# Patient Record
Sex: Female | Born: 1950 | Race: White | Hispanic: No | Marital: Married | Smoking: Never smoker
Health system: Southern US, Community
[De-identification: ages and names within clinical notes are randomized; demographics above are authoritative.]

## PROBLEM LIST (undated history)

## (undated) DIAGNOSIS — M8580 Other specified disorders of bone density and structure, unspecified site: Secondary | ICD-10-CM

## (undated) DIAGNOSIS — H3530 Unspecified macular degeneration: Secondary | ICD-10-CM

## (undated) DIAGNOSIS — E785 Hyperlipidemia, unspecified: Secondary | ICD-10-CM

## (undated) HISTORY — DX: Unspecified macular degeneration: H35.30

## (undated) HISTORY — DX: Other specified disorders of bone density and structure, unspecified site: M85.80

## (undated) HISTORY — DX: Hyperlipidemia, unspecified: E78.5

---

## 2007-04-24 ENCOUNTER — Ambulatory Visit: Payer: Self-pay | Admitting: Gastroenterology

## 2012-03-23 ENCOUNTER — Ambulatory Visit: Payer: Self-pay | Admitting: Family Medicine

## 2013-04-02 ENCOUNTER — Ambulatory Visit: Payer: Self-pay | Admitting: Family Medicine

## 2014-04-03 ENCOUNTER — Ambulatory Visit: Payer: Self-pay | Admitting: Family Medicine

## 2014-12-25 ENCOUNTER — Ambulatory Visit (INDEPENDENT_AMBULATORY_CARE_PROVIDER_SITE_OTHER): Payer: BLUE CROSS/BLUE SHIELD

## 2014-12-25 DIAGNOSIS — Z23 Encounter for immunization: Secondary | ICD-10-CM

## 2015-06-24 ENCOUNTER — Encounter: Payer: Self-pay | Admitting: Family Medicine

## 2015-06-24 ENCOUNTER — Other Ambulatory Visit: Payer: Self-pay | Admitting: Family Medicine

## 2015-06-24 ENCOUNTER — Ambulatory Visit (INDEPENDENT_AMBULATORY_CARE_PROVIDER_SITE_OTHER): Payer: Managed Care, Other (non HMO) | Admitting: Family Medicine

## 2015-06-24 VITALS — BP 100/62 | HR 74 | Temp 98.6°F | Resp 16 | Ht 65.0 in | Wt 186.2 lb

## 2015-06-24 DIAGNOSIS — Z0000 Encounter for general adult medical examination without abnormal findings: Secondary | ICD-10-CM | POA: Diagnosis not present

## 2015-06-24 DIAGNOSIS — Z780 Asymptomatic menopausal state: Secondary | ICD-10-CM | POA: Diagnosis not present

## 2015-06-24 DIAGNOSIS — Z1239 Encounter for other screening for malignant neoplasm of breast: Secondary | ICD-10-CM

## 2015-06-24 NOTE — Progress Notes (Signed)
Name: Laura Oneill   MRN: [id number]    DOB: October 17, 1950   Date:06/24/2015       Progress Note  Subjective  Chief Complaint  Chief Complaint  Patient presents with  . Annual Exam    CPE    HPI  Pt. Presents for a Complete Physical Exam. She is doing well. Right leg does bother her occasionally. Pain in her buttocks radiating down to her lower leg.   Past Medical History  Diagnosis Date  . Hyperlipidemia     History reviewed. No pertinent past surgical history.  Family History  Problem Relation Age of Onset  . Hypothyroidism Mother   . COPD Father     Social History   Social History  . Marital Status: Married    Spouse Name: N/A  . Number of Children: N/A  . Years of Education: N/A   Occupational History  . Not on file.   Social History Main Topics  . Smoking status: Never Smoker   . Smokeless tobacco: Never Used  . Alcohol Use: 0.0 oz/week    0 Standard drinks or equivalent per week     Comment: occasional  . Drug Use: No  . Sexual Activity: No   Other Topics Concern  . Not on file   Social History Narrative  . No narrative on file     Current outpatient prescriptions:  Marland Kitchen  Multiple Vitamin (MULTIVITAMIN) tablet, Take 1 tablet by mouth daily., Disp: , Rfl:   No Known Allergies   Review of Systems  Constitutional: Negative for fever, chills, weight loss and malaise/fatigue.  HENT: Negative for sore throat.   Eyes: Negative for blurred vision and double vision.  Respiratory: Negative for cough and shortness of breath.   Cardiovascular: Negative for chest pain and leg swelling.  Gastrointestinal: Negative for heartburn, nausea, vomiting, abdominal pain, diarrhea and blood in stool.  Genitourinary: Negative for dysuria and hematuria.  Musculoskeletal: Positive for myalgias (right leg pain, worse with sitting, travels down from buttocks to right lower leg.). Negative for back pain and joint pain.  Skin: Negative for rash.  Neurological: Negative  for dizziness and headaches.  Psychiatric/Behavioral: Negative for depression. The patient is not nervous/anxious and does not have insomnia.       Objective  Filed Vitals:   06/24/15 1105  BP: 100/62  Pulse: 74  Temp: 98.6 F (37 C)  TempSrc: Oral  Resp: 16  Height: 5\' 5"  (1.651 m)  Weight: 186 lb 3.2 oz (84.46 kg)  SpO2: 98%    Physical Exam  Constitutional: She is oriented to person, place, and time and well-developed, well-nourished, and in no distress. Vital signs are normal.  HENT:  Head: Normocephalic and atraumatic.  Right Ear: Tympanic membrane and ear canal normal.  Left Ear: Tympanic membrane and ear canal normal.  Mouth/Throat: No posterior oropharyngeal erythema.  Cardiovascular: Normal rate, regular rhythm, S1 normal and S2 normal.   Pulmonary/Chest: Breath sounds normal.  Abdominal: Normal appearance and bowel sounds are normal. There is no tenderness.  Abdominal wall swelling around the epigastric region, soft, easily reducible.   Neurological: She is alert and oriented to person, place, and time.  Psychiatric: Mood, memory, affect and judgment normal.  Nursing note and vitals reviewed.      Assessment & Plan  1. Annual physical exam Age appropriate laboratory and screening studies obtained.  - CBC with Differential - Comprehensive Metabolic Panel (CMET) - Lipid Profile - TSH - Vitamin D (25 hydroxy) - Cytology -  PAP  2. Screening for breast cancer  - MM Digital Screening; Future  3. Post-menopausal  - DG Bone Density; Future   Montrail Mehrer Asad A. [facility] 06/24/2015 11:27 AM

## 2015-06-25 ENCOUNTER — Encounter: Payer: BLUE CROSS/BLUE SHIELD | Admitting: Family Medicine

## 2015-06-27 LAB — PAP LB, RFX HPV ASCU: PAP SMEAR COMMENT: 0

## 2015-06-28 LAB — CBC WITH DIFFERENTIAL/PLATELET
BASOS: 0 %
Basophils Absolute: 0 10*3/uL (ref 0.0–0.2)
EOS (ABSOLUTE): 0.1 10*3/uL (ref 0.0–0.4)
EOS: 3 %
HEMATOCRIT: 40.3 % (ref 34.0–46.6)
Hemoglobin: 13.4 g/dL (ref 11.1–15.9)
IMMATURE GRANULOCYTES: 0 %
Immature Grans (Abs): 0 10*3/uL (ref 0.0–0.1)
LYMPHS ABS: 1.2 10*3/uL (ref 0.7–3.1)
Lymphs: 23 %
MCH: 29.4 pg (ref 26.6–33.0)
MCHC: 33.3 g/dL (ref 31.5–35.7)
MCV: 88 fL (ref 79–97)
MONOS ABS: 0.5 10*3/uL (ref 0.1–0.9)
Monocytes: 9 %
Neutrophils Absolute: 3.4 10*3/uL (ref 1.4–7.0)
Neutrophils: 65 %
PLATELETS: 250 10*3/uL (ref 150–379)
RBC: 4.56 x10E6/uL (ref 3.77–5.28)
RDW: 12.6 % (ref 12.3–15.4)
WBC: 5.3 10*3/uL (ref 3.4–10.8)

## 2015-06-28 LAB — LIPID PANEL
CHOL/HDL RATIO: 3.6 ratio (ref 0.0–4.4)
Cholesterol, Total: 216 mg/dL — ABNORMAL HIGH (ref 100–199)
HDL: 60 mg/dL (ref >39)
LDL CALC: 140 mg/dL — AB (ref 0–99)
TRIGLYCERIDES: 80 mg/dL (ref 0–149)
VLDL CHOLESTEROL CAL: 16 mg/dL (ref 5–40)

## 2015-06-28 LAB — COMPREHENSIVE METABOLIC PANEL
A/G RATIO: 2 (ref 1.2–2.2)
ALT: 14 IU/L (ref 0–32)
AST: 17 IU/L (ref 0–40)
Albumin: 4.3 g/dL (ref 3.6–4.8)
Alkaline Phosphatase: 60 IU/L (ref 39–117)
BUN/Creatinine Ratio: 24 (ref 11–26)
BUN: 16 mg/dL (ref 8–27)
Bilirubin Total: 0.4 mg/dL (ref 0.0–1.2)
CALCIUM: 9.2 mg/dL (ref 8.7–10.3)
CO2: 26 mmol/L (ref 18–29)
CREATININE: 0.68 mg/dL (ref 0.57–1.00)
Chloride: 103 mmol/L (ref 96–106)
GFR, EST AFRICAN AMERICAN: 108 mL/min/{1.73_m2} (ref >59)
GFR, EST NON AFRICAN AMERICAN: 93 mL/min/{1.73_m2} (ref >59)
GLUCOSE: 91 mg/dL (ref 65–99)
Globulin, Total: 2.1 g/dL (ref 1.5–4.5)
Potassium: 5.3 mmol/L — ABNORMAL HIGH (ref 3.5–5.2)
Sodium: 142 mmol/L (ref 134–144)
TOTAL PROTEIN: 6.4 g/dL (ref 6.0–8.5)

## 2015-06-28 LAB — VITAMIN D 25 HYDROXY (VIT D DEFICIENCY, FRACTURES): Vit D, 25-Hydroxy: 34.2 ng/mL (ref 30.0–100.0)

## 2015-06-28 LAB — TSH: TSH: 2.26 u[IU]/mL (ref 0.450–4.500)

## 2015-08-20 ENCOUNTER — Telehealth: Payer: Self-pay | Admitting: Family Medicine

## 2015-08-20 NOTE — Telephone Encounter (Signed)
I do not see anything for a bone density in her chart. I will forward to [employer]

## 2015-08-20 NOTE — Telephone Encounter (Signed)
Referral for bone density scan was ordered during patient's annual physical exam in March

## 2015-08-20 NOTE — Telephone Encounter (Signed)
Scheduled for 09/03/15 at 11. Left vm with appointment date and time

## 2015-08-20 NOTE — Telephone Encounter (Signed)
Patient is checking status on referral for her bone density. Was seen in March for her annual physical

## 2015-09-03 ENCOUNTER — Ambulatory Visit: Payer: Self-pay

## 2015-10-20 ENCOUNTER — Ambulatory Visit
Admission: RE | Admit: 2015-10-20 | Discharge: 2015-10-20 | Disposition: A | Discharge status: Home / Self Care | Payer: Managed Care, Other (non HMO) | Source: Ambulatory Visit | Attending: Family Medicine | Admitting: Family Medicine

## 2015-10-20 ENCOUNTER — Telehealth: Payer: Self-pay | Admitting: Emergency Medicine

## 2015-10-20 DIAGNOSIS — Z780 Asymptomatic menopausal state: Secondary | ICD-10-CM

## 2015-10-20 DIAGNOSIS — M85851 Other specified disorders of bone density and structure, right thigh: Secondary | ICD-10-CM | POA: Insufficient documentation

## 2015-10-20 DIAGNOSIS — Z1239 Encounter for other screening for malignant neoplasm of breast: Secondary | ICD-10-CM

## 2015-10-20 DIAGNOSIS — Z1231 Encounter for screening mammogram for malignant neoplasm of breast: Secondary | ICD-10-CM | POA: Diagnosis not present

## 2015-10-20 NOTE — Telephone Encounter (Signed)
Patient notified of mammogram results.

## 2015-10-20 NOTE — Telephone Encounter (Signed)
Patient notified of Bone Density results.  

## 2016-01-22 ENCOUNTER — Ambulatory Visit (INDEPENDENT_AMBULATORY_CARE_PROVIDER_SITE_OTHER): Payer: Managed Care, Other (non HMO)

## 2016-01-22 DIAGNOSIS — Z23 Encounter for immunization: Secondary | ICD-10-CM | POA: Diagnosis not present

## 2016-07-22 ENCOUNTER — Encounter: Payer: Managed Care, Other (non HMO) | Admitting: Family Medicine

## 2016-08-23 ENCOUNTER — Telehealth: Payer: Self-pay | Admitting: Family Medicine

## 2016-08-23 NOTE — Telephone Encounter (Signed)
Pt has upcoming appointment this week for her annual and she would like to come tomorrow if possible to go ahead and do lab work due to fasting. Please return call [phone]

## 2016-08-24 NOTE — Telephone Encounter (Signed)
Labs will be ordered during office visit for CPE on 08/25/16

## 2016-08-25 ENCOUNTER — Ambulatory Visit (INDEPENDENT_AMBULATORY_CARE_PROVIDER_SITE_OTHER): Payer: BLUE CROSS/BLUE SHIELD | Admitting: Family Medicine

## 2016-08-25 ENCOUNTER — Encounter: Payer: Self-pay | Admitting: Family Medicine

## 2016-08-25 VITALS — BP 108/76 | HR 73 | Temp 98.4°F | Resp 16 | Ht 65.0 in | Wt 184.7 lb

## 2016-08-25 DIAGNOSIS — Z1239 Encounter for other screening for malignant neoplasm of breast: Secondary | ICD-10-CM

## 2016-08-25 DIAGNOSIS — Z0000 Encounter for general adult medical examination without abnormal findings: Secondary | ICD-10-CM

## 2016-08-25 DIAGNOSIS — E785 Hyperlipidemia, unspecified: Secondary | ICD-10-CM | POA: Insufficient documentation

## 2016-08-25 DIAGNOSIS — Z1159 Encounter for screening for other viral diseases: Secondary | ICD-10-CM | POA: Diagnosis not present

## 2016-08-25 DIAGNOSIS — Z1231 Encounter for screening mammogram for malignant neoplasm of breast: Secondary | ICD-10-CM | POA: Diagnosis not present

## 2016-08-25 DIAGNOSIS — E7800 Pure hypercholesterolemia, unspecified: Secondary | ICD-10-CM | POA: Diagnosis not present

## 2016-08-25 NOTE — Progress Notes (Signed)
Name: Laura Oneill   MRN: [id number]    DOB: 1951-02-23   Date:08/25/2016       Progress Note  Subjective  Chief Complaint  Chief Complaint  Patient presents with  . Annual Exam    CPE    Pt. Presents for Annual Physical Exam.   Last colonoscopy was reportedly in 2009, had a polyp which was benign. Repeat in 2019. She is due for mammogram in the current year. DEXA scan in 2019. Pap Smear was normal in 2017.  She is due for Hepatitis C screening.  She has received Zostavax in the past.   Past Medical History:  Diagnosis Date  . Hyperlipidemia     History reviewed. No pertinent surgical history.  Family History  Problem Relation Age of Onset  . Hypothyroidism Mother   . COPD Father   . Breast cancer Neg Hx     Social History   Social History  . Marital status: Married    Spouse name: N/A  . Number of children: N/A  . Years of education: N/A   Occupational History  . Not on file.   Social History Main Topics  . Smoking status: Never Smoker  . Smokeless tobacco: Never Used  . Alcohol use 0.0 oz/week     Comment: occasional  . Drug use: No  . Sexual activity: No   Other Topics Concern  . Not on file   Social History Narrative  . No narrative on file     Current Outpatient Prescriptions:  Marland Kitchen  Multiple Vitamin (MULTIVITAMIN) tablet, Take 1 tablet by mouth daily., Disp: , Rfl:  .  Multiple Vitamins-Minerals (PRESERVISION AREDS 2 PO), Take by mouth., Disp: , Rfl:   Allergies  Allergen Reactions  . Latex Rash     Review of Systems  Constitutional: Negative for chills, fever, malaise/fatigue and weight loss.  HENT: Negative for congestion, ear pain, sinus pain and sore throat.   Eyes: Negative for blurred vision and double vision.  Respiratory: Negative for cough and shortness of breath.   Cardiovascular: Negative for chest pain and leg swelling.  Gastrointestinal: Negative for abdominal pain, nausea and vomiting.  Genitourinary: Negative for  dysuria and hematuria.  Musculoskeletal: Positive for joint pain (right arm pain, worse with activity such as with computer our counting bills. ). Negative for back pain and neck pain.  Neurological: Negative for dizziness and headaches.  Psychiatric/Behavioral: Negative for depression. The patient is not nervous/anxious and does not have insomnia.       Objective  Vitals:   08/25/16 1108  BP: 108/76  Pulse: 73  Resp: 16  Temp: 98.4 F (36.9 C)  TempSrc: Oral  SpO2: 96%  Weight: 184 lb 11.2 oz (83.8 kg)  Height: 5\' 5"  (1.651 m)    Physical Exam  Constitutional: She is oriented to person, place, and time and well-developed, well-nourished, and in no distress.  HENT:  Head: Normocephalic and atraumatic.  Right Ear: External ear normal.  Left Ear: External ear normal.  Mouth/Throat: Oropharynx is clear and moist.  Eyes: Conjunctivae are normal. Pupils are equal, round, and reactive to light.  Neck: Normal range of motion. Neck supple. No thyromegaly present.  Cardiovascular: Normal rate, regular rhythm and normal heart sounds.   No murmur heard. Pulmonary/Chest: Effort normal and breath sounds normal. She has no wheezes.  Abdominal: Soft. Bowel sounds are normal. There is no tenderness.    Abdominal wall hernia, easily reducible.  Musculoskeletal: She exhibits no edema.  Right wrist: She exhibits tenderness. She exhibits no swelling, no effusion and no crepitus.       Right forearm: She exhibits tenderness. She exhibits no swelling and no edema.       Arms: Neurological: She is alert and oriented to person, place, and time.  Skin: Skin is warm and dry.  Psychiatric: Mood, memory, affect and judgment normal.  Nursing note and vitals reviewed.    Assessment & Plan  1. Well woman exam without gynecological exam Obtain age-appropriate laboratory screening - COMPLETE METABOLIC PANEL WITH GFR - TSH - VITAMIN D 25 Hydroxy (Vit-D Deficiency, Fractures) - CBC with  Differential/Platelet  2. Screening for breast cancer  - MM DIGITAL SCREENING BILATERAL; Future  3. Pure hypercholesterolemia Obtain FLP, start on statin if indicated - Lipid panel  4. Need for hepatitis C screening test  - Hepatitis C antibody   Laura Oneill Laura A. Laura Oneill 08/25/2016 11:57 AM

## 2016-08-26 LAB — COMPLETE METABOLIC PANEL WITH GFR
ALBUMIN: 4.2 g/dL (ref 3.6–5.1)
ALT: 11 U/L (ref 6–29)
AST: 15 U/L (ref 10–35)
Alkaline Phosphatase: 49 U/L (ref 33–130)
BILIRUBIN TOTAL: 0.6 mg/dL (ref 0.2–1.2)
BUN: 19 mg/dL (ref 7–25)
CALCIUM: 9.3 mg/dL (ref 8.6–10.4)
CO2: 26 mmol/L (ref 20–31)
Chloride: 105 mmol/L (ref 98–110)
Creat: 0.89 mg/dL (ref 0.50–0.99)
GFR, EST AFRICAN AMERICAN: 79 mL/min (ref >=60)
GFR, EST NON AFRICAN AMERICAN: 69 mL/min (ref >=60)
Glucose, Bld: 89 mg/dL (ref 65–99)
Potassium: 4.6 mmol/L (ref 3.5–5.3)
Sodium: 140 mmol/L (ref 135–146)
Total Protein: 6.2 g/dL (ref 6.1–8.1)

## 2016-08-26 LAB — CBC WITH DIFFERENTIAL/PLATELET
BASOS ABS: 49 {cells}/uL (ref 0–200)
Basophils Relative: 1 %
EOS ABS: 147 {cells}/uL (ref 15–500)
EOS PCT: 3 %
HCT: 42.1 % (ref 35.0–45.0)
Hemoglobin: 13.8 g/dL (ref 11.7–15.5)
Lymphocytes Relative: 28 %
Lymphs Abs: 1372 cells/uL (ref 850–3900)
MCH: 29.2 pg (ref 27.0–33.0)
MCHC: 32.8 g/dL (ref 32.0–36.0)
MCV: 89 fL (ref 80.0–100.0)
MONOS PCT: 8 %
MPV: 9.5 fL (ref 7.5–12.5)
Monocytes Absolute: 392 cells/uL (ref 200–950)
NEUTROS ABS: 2940 {cells}/uL (ref 1500–7800)
Neutrophils Relative %: 60 %
PLATELETS: 246 10*3/uL (ref 140–400)
RBC: 4.73 MIL/uL (ref 3.80–5.10)
RDW: 13.3 % (ref 11.0–15.0)
WBC: 4.9 10*3/uL (ref 3.8–10.8)

## 2016-08-26 LAB — HEPATITIS C ANTIBODY: HCV Ab: NEGATIVE

## 2016-08-26 LAB — LIPID PANEL
Cholesterol: 215 mg/dL — ABNORMAL HIGH (ref <200)
HDL: 60 mg/dL (ref >50)
LDL Cholesterol: 142 mg/dL — ABNORMAL HIGH (ref <100)
Total CHOL/HDL Ratio: 3.6 Ratio (ref <5.0)
Triglycerides: 65 mg/dL (ref <150)
VLDL: 13 mg/dL (ref <30)

## 2016-08-26 LAB — TSH: TSH: 2.12 m[IU]/L

## 2016-08-27 LAB — VITAMIN D 25 HYDROXY (VIT D DEFICIENCY, FRACTURES): Vit D, 25-Hydroxy: 46 ng/mL (ref 30–100)

## 2016-10-25 ENCOUNTER — Ambulatory Visit
Admission: RE | Admit: 2016-10-25 | Discharge: 2016-10-25 | Disposition: A | Discharge status: Home / Self Care | Payer: BLUE CROSS/BLUE SHIELD | Source: Ambulatory Visit | Attending: Family Medicine | Admitting: Family Medicine

## 2016-10-25 ENCOUNTER — Other Ambulatory Visit: Payer: Self-pay | Admitting: Family Medicine

## 2016-10-25 DIAGNOSIS — Z1239 Encounter for other screening for malignant neoplasm of breast: Secondary | ICD-10-CM

## 2016-10-25 DIAGNOSIS — Z1231 Encounter for screening mammogram for malignant neoplasm of breast: Secondary | ICD-10-CM | POA: Insufficient documentation

## 2017-01-10 ENCOUNTER — Ambulatory Visit (INDEPENDENT_AMBULATORY_CARE_PROVIDER_SITE_OTHER): Payer: BLUE CROSS/BLUE SHIELD | Admitting: Emergency Medicine

## 2017-01-10 DIAGNOSIS — Z23 Encounter for immunization: Secondary | ICD-10-CM | POA: Diagnosis not present

## 2017-09-02 DIAGNOSIS — Z836 Family history of other diseases of the respiratory system: Secondary | ICD-10-CM | POA: Diagnosis not present

## 2017-09-02 DIAGNOSIS — Z91040 Latex allergy status: Secondary | ICD-10-CM | POA: Diagnosis not present

## 2017-09-02 DIAGNOSIS — Z6834 Body mass index (BMI) 34.0-34.9, adult: Secondary | ICD-10-CM | POA: Diagnosis not present

## 2017-09-02 DIAGNOSIS — E669 Obesity, unspecified: Secondary | ICD-10-CM | POA: Diagnosis not present

## 2017-09-05 ENCOUNTER — Encounter: Payer: Self-pay | Admitting: Family Medicine

## 2017-09-05 ENCOUNTER — Ambulatory Visit (INDEPENDENT_AMBULATORY_CARE_PROVIDER_SITE_OTHER): Payer: Medicare HMO | Admitting: Family Medicine

## 2017-09-05 ENCOUNTER — Other Ambulatory Visit: Payer: Self-pay

## 2017-09-05 VITALS — BP 122/82 | HR 74 | Temp 98.6°F | Resp 12 | Ht 62.13 in | Wt 184.1 lb

## 2017-09-05 DIAGNOSIS — N182 Chronic kidney disease, stage 2 (mild): Secondary | ICD-10-CM | POA: Diagnosis not present

## 2017-09-05 DIAGNOSIS — R69 Illness, unspecified: Secondary | ICD-10-CM | POA: Diagnosis not present

## 2017-09-05 DIAGNOSIS — Z114 Encounter for screening for human immunodeficiency virus [HIV]: Secondary | ICD-10-CM | POA: Diagnosis not present

## 2017-09-05 DIAGNOSIS — Z0000 Encounter for general adult medical examination without abnormal findings: Secondary | ICD-10-CM

## 2017-09-05 DIAGNOSIS — M85859 Other specified disorders of bone density and structure, unspecified thigh: Secondary | ICD-10-CM

## 2017-09-05 DIAGNOSIS — Z23 Encounter for immunization: Secondary | ICD-10-CM | POA: Diagnosis not present

## 2017-09-05 DIAGNOSIS — Z1231 Encounter for screening mammogram for malignant neoplasm of breast: Secondary | ICD-10-CM

## 2017-09-05 DIAGNOSIS — E782 Mixed hyperlipidemia: Secondary | ICD-10-CM | POA: Diagnosis not present

## 2017-09-05 DIAGNOSIS — Z1211 Encounter for screening for malignant neoplasm of colon: Secondary | ICD-10-CM

## 2017-09-05 NOTE — Assessment & Plan Note (Signed)
USPSTF grade A and B recommendations reviewed with patient; age-appropriate recommendations, preventive care, screening tests, etc discussed and encouraged; healthy living encouraged; see AVS for patient education given to patient  

## 2017-09-05 NOTE — Assessment & Plan Note (Signed)
Encouraged hydration, avoid NSAIDs

## 2017-09-05 NOTE — Patient Instructions (Addendum)
Consider getting the new shingles vaccine called Shingrix; that is available for individuals 66 years of age and older, and is recommended even if you have had shingles in the past and/or already received the old shingles vaccine (Zostavax); it is a two-part series, and is available at many local pharmacies  Check out the information at familydoctor.org entitled "Nutrition for Weight Loss: What You Need to Know about Fad Diets" Try to lose between 1-2 pounds per week by taking in fewer calories and burning off more calories You can succeed by limiting portions, limiting foods dense in calories and fat, becoming more active, and drinking 8 glasses of water a day (64 ounces) Don't skip meals, especially breakfast, as skipping meals may alter your metabolism Do not use over-the-counter weight loss pills or gimmicks that claim rapid weight loss A healthy BMI (or body mass index) is between 18.5 and 24.9 You can calculate your ideal BMI at the [place] website ClubMonetize.fr  Please do call to schedule your mammogram and DEXA; the number to schedule one at either [facility] or [facility] is [phone]  Preventing Unhealthy Weight Gain, Adult Staying at a healthy weight is important. When fat builds up in your body, you may become overweight or obese. These conditions put you at greater risk for developing certain health problems, such as heart disease, diabetes, sleeping problems, joint problems, and some cancers. Unhealthy weight gain is often the result of making unhealthy choices in what you eat. It is also a result of not getting enough exercise. You can make changes to your lifestyle to prevent obesity and stay as healthy as possible. What nutrition changes can be made? To maintain a healthy weight and prevent obesity:  Eat only as much as your body needs. To do this: ? Pay attention to signs that you are hungry or  full. Stop eating as soon as you feel full. ? If you feel hungry, try drinking water first. Drink enough water so your urine is clear or pale yellow. ? Eat smaller portions. ? Look at serving sizes on food labels. Most foods contain more than one serving per container. ? Eat the recommended amount of calories for your gender and activity level. While most active people should eat around 2,000 calories per day, if you are trying to lose weight or are not very active, you main need to eat less calories. Talk to your health care provider or dietitian about how many calories you should eat each day.  Choose healthy foods, such as: ? Fruits and vegetables. Try to fill at least half of your plate at each meal with fruits and vegetables. ? Whole grains, such as whole wheat bread, brown rice, and quinoa. ? Lean meats, such as chicken or fish. ? Other healthy proteins, such as beans, eggs, or tofu. ? Healthy fats, such as nuts, seeds, fatty fish, and olive oil. ? Low-fat or fat-free dairy.  Check food labels and avoid food and drinks that: ? Are high in calories. ? Have added sugar. ? Are high in sodium. ? Have saturated fats or trans fats.  Limit how much you eat of the following foods: ? Prepackaged meals. ? Fast food. ? Fried foods. ? Processed meat, such as bacon, sausage, and deli meats. ? Fatty cuts of red meat and poultry with skin.  Cook foods in healthier ways, such as by baking, broiling, or grilling.  When grocery shopping, try to shop around the outside of the store. This helps  you buy mostly fresh foods and avoid canned and prepackaged foods.  What lifestyle changes can be made?  Exercise at least 30 minutes 5 or more days each week. Exercising includes brisk walking, yard work, biking, running, swimming, and team sports like basketball and soccer. Ask your health care provider which exercises are safe for you.  Do not use any products that contain nicotine or tobacco, such as  cigarettes and e-cigarettes. If you need help quitting, ask your health care provider.  Limit alcohol intake to no more than 1 drink a day for nonpregnant women and 2 drinks a day for men. One drink equals 12 oz of beer, 5 oz of wine, or 1 oz of hard liquor.  Try to get 7-9 hours of sleep each night. What other changes can be made?  Keep a food and activity journal to keep track of: ? What you ate and how many calories you had. Remember to count sauces, dressings, and side dishes. ? Whether you were active, and what exercises you did. ? Your calorie, weight, and activity goals.  Check your weight regularly. Track any changes. If you notice you have gained weight, make changes to your diet or activity routine.  Avoid taking weight-loss medicines or supplements. Talk to your health care provider before starting any new medicine or supplement.  Talk to your health care provider before trying any new diet or exercise plan. Why are these changes important? Eating healthy, staying active, and having healthy habits not only help prevent obesity, they also:  Help you to manage stress and emotions.  Help you to connect with friends and family.  Improve your self-esteem.  Improve your sleep.  Prevent long-term health problems.  What can happen if changes are not made? Being obese or overweight can cause you to develop joint or bone problems, which can make it hard for you to stay active or do activities you enjoy. Being obese or overweight also puts stress on your heart and lungs and can lead to health problems like diabetes, heart disease, and some cancers. Where to find more information: Talk with your health care provider or a dietitian about healthy eating and healthy lifestyle choices. You may also find other information through these resources:  U.[place]. Department of Agriculture MyPlate: FormerBoss.no  American Heart Association: www.heart.org  Centers for Disease Control and  Prevention: http://www.wolf.info/  Summary  Staying at a healthy weight is important. It helps prevent certain diseases and health problems, such as heart disease, diabetes, joint problems, sleep disorders, and some cancers.  Being obese or overweight can cause you to develop joint or bone problems, which can make it hard for you to stay active or do activities you enjoy.  You can prevent unhealthy weight gain by eating a healthy diet, exercising regularly, not smoking, limiting alcohol, and getting enough sleep.  Talk with your health care provider or a dietitian for guidance about healthy eating and healthy lifestyle choices. This information is not intended to replace advice given to you by your health care provider. Make sure you discuss any questions you have with your health care provider. Document Released: 03/23/2016 Document Revised: 04/28/2016 Document Reviewed: 04/28/2016 Elsevier Interactive Patient Education  2018 [place] Maintenance, Female Adopting a healthy lifestyle and getting preventive care can go a long way to promote health and wellness. Talk with your health care provider about what schedule of regular examinations is right for you. This is a good chance for you to check in with  your provider about disease prevention and staying healthy. In between checkups, there are plenty of things you can do on your own. Experts have done a lot of research about which lifestyle changes and preventive measures are most likely to keep you healthy. Ask your health care provider for more information. Weight and diet Eat a healthy diet  Be sure to include plenty of vegetables, fruits, low-fat dairy products, and lean protein.  Do not eat a lot of foods high in solid fats, added sugars, or salt.  Get regular exercise. This is one of the most important things you can do for your health. ? Most adults should exercise for at least 150 minutes each week. The exercise should increase your  heart rate and make you sweat (moderate-intensity exercise). ? Most adults should also do strengthening exercises at least twice a week. This is in addition to the moderate-intensity exercise.  Maintain a healthy weight  Body mass index (BMI) is a measurement that can be used to identify possible weight problems. It estimates body fat based on height and weight. Your health care provider can help determine your BMI and help you achieve or maintain a healthy weight.  For females 60 years of age and older: ? A BMI below 18.5 is considered underweight. ? A BMI of 18.5 to 24.9 is normal. ? A BMI of 25 to 29.9 is considered overweight. ? A BMI of 30 and above is considered obese.  Watch levels of cholesterol and blood lipids  You should start having your blood tested for lipids and cholesterol at 66 years of age, then have this test every 5 years.  You may need to have your cholesterol levels checked more often if: ? Your lipid or cholesterol levels are high. ? You are older than 66 years of age. ? You are at high risk for heart disease.  Cancer screening Lung Cancer  Lung cancer screening is recommended for adults 52-14 years old who are at high risk for lung cancer because of a history of smoking.  A yearly low-dose CT scan of the lungs is recommended for people who: ? Currently smoke. ? Have quit within the past 15 years. ? Have at least a 30-pack-year history of smoking. A pack year is smoking an average of one pack of cigarettes a day for 1 year.  Yearly screening should continue until it has been 15 years since you quit.  Yearly screening should stop if you develop a health problem that would prevent you from having lung cancer treatment.  Breast Cancer  Practice breast self-awareness. This means understanding how your breasts normally appear and feel.  It also means doing regular breast self-exams. Let your health care provider know about any changes, no matter how  small.  If you are in your 20s or 30s, you should have a clinical breast exam (CBE) by a health care provider every 1-3 years as part of a regular health exam.  If you are 64 or older, have a CBE every year. Also consider having a breast X-ray (mammogram) every year.  If you have a family history of breast cancer, talk to your health care provider about genetic screening.  If you are at high risk for breast cancer, talk to your health care provider about having an MRI and a mammogram every year.  Breast cancer gene (BRCA) assessment is recommended for women who have family members with BRCA-related cancers. BRCA-related cancers include: ? Breast. ? Ovarian. ? Tubal. ? Peritoneal cancers.  Results of the assessment will determine the need for genetic counseling and BRCA1 and BRCA2 testing.  Cervical Cancer Your health care provider may recommend that you be screened regularly for cancer of the pelvic organs (ovaries, uterus, and vagina). This screening involves a pelvic examination, including checking for microscopic changes to the surface of your cervix (Pap test). You may be encouraged to have this screening done every 3 years, beginning at age 38.  For women ages 55-65, health care providers may recommend pelvic exams and Pap testing every 3 years, or they may recommend the Pap and pelvic exam, combined with testing for human papilloma virus (HPV), every 5 years. Some types of HPV increase your risk of cervical cancer. Testing for HPV may also be done on women of any age with unclear Pap test results.  Other health care providers may not recommend any screening for nonpregnant women who are considered low risk for pelvic cancer and who do not have symptoms. Ask your health care provider if a screening pelvic exam is right for you.  If you have had past treatment for cervical cancer or a condition that could lead to cancer, you need Pap tests and screening for cancer for at least 20 years  after your treatment. If Pap tests have been discontinued, your risk factors (such as having a new sexual partner) need to be reassessed to determine if screening should resume. Some women have medical problems that increase the chance of getting cervical cancer. In these cases, your health care provider may recommend more frequent screening and Pap tests.  Colorectal Cancer  This type of cancer can be detected and often prevented.  Routine colorectal cancer screening usually begins at 66 years of age and continues through 66 years of age.  Your health care provider may recommend screening at an earlier age if you have risk factors for colon cancer.  Your health care provider may also recommend using home test kits to check for hidden blood in the stool.  A small camera at the end of a tube can be used to examine your colon directly (sigmoidoscopy or colonoscopy). This is done to check for the earliest forms of colorectal cancer.  Routine screening usually begins at age 24.  Direct examination of the colon should be repeated every 5-10 years through 66 years of age. However, you may need to be screened more often if early forms of precancerous polyps or small growths are found.  Skin Cancer  Check your skin from head to toe regularly.  Tell your health care provider about any new moles or changes in moles, especially if there is a change in a mole's shape or color.  Also tell your health care provider if you have a mole that is larger than the size of a pencil eraser.  Always use sunscreen. Apply sunscreen liberally and repeatedly throughout the day.  Protect yourself by wearing long sleeves, pants, a wide-brimmed hat, and sunglasses whenever you are outside.  Heart disease, diabetes, and high blood pressure  High blood pressure causes heart disease and increases the risk of stroke. High blood pressure is more likely to develop in: ? People who have blood pressure in the high end of  the normal range (130-139/85-89 mm Hg). ? People who are overweight or obese. ? People who are African American.  If you are 20-45 years of age, have your blood pressure checked every 3-5 years. If you are 78 years of age or older, have your blood pressure  checked every year. You should have your blood pressure measured twice-once when you are at a hospital or clinic, and once when you are not at a hospital or clinic. Record the average of the two measurements. To check your blood pressure when you are not at a hospital or clinic, you can use: ? An automated blood pressure machine at a pharmacy. ? A home blood pressure monitor.  If you are between 79 years and 66 years old, ask your health care provider if you should take aspirin to prevent strokes.  Have regular diabetes screenings. This involves taking a blood sample to check your fasting blood sugar level. ? If you are at a normal weight and have a low risk for diabetes, have this test once every three years after 66 years of age. ? If you are overweight and have a high risk for diabetes, consider being tested at a younger age or more often. Preventing infection Hepatitis B  If you have a higher risk for hepatitis B, you should be screened for this virus. You are considered at high risk for hepatitis B if: ? You were born in a country where hepatitis B is common. Ask your health care provider which countries are considered high risk. ? Your parents were born in a high-risk country, and you have not been immunized against hepatitis B (hepatitis B vaccine). ? You have HIV or AIDS. ? You use needles to inject street drugs. ? You live with someone who has hepatitis B. ? You have had sex with someone who has hepatitis B. ? You get hemodialysis treatment. ? You take certain medicines for conditions, including cancer, organ transplantation, and autoimmune conditions.  Hepatitis C  Blood testing is recommended for: ? Everyone born from [birth year]  through 1964. ? Anyone with known risk factors for hepatitis C.  Sexually transmitted infections (STIs)  You should be screened for sexually transmitted infections (STIs) including gonorrhea and chlamydia if: ? You are sexually active and are younger than 66 years of age. ? You are older than 66 years of age and your health care provider tells you that you are at risk for this type of infection. ? Your sexual activity has changed since you were last screened and you are at an increased risk for chlamydia or gonorrhea. Ask your health care provider if you are at risk.  If you do not have HIV, but are at risk, it may be recommended that you take a prescription medicine daily to prevent HIV infection. This is called pre-exposure prophylaxis (PrEP). You are considered at risk if: ? You are sexually active and do not regularly use condoms or know the HIV status of your partner(s). ? You take drugs by injection. ? You are sexually active with a partner who has HIV.  Talk with your health care provider about whether you are at high risk of being infected with HIV. If you choose to begin PrEP, you should first be tested for HIV. You should then be tested every 3 months for as long as you are taking PrEP. Pregnancy  If you are premenopausal and you may become pregnant, ask your health care provider about preconception counseling.  If you may become pregnant, take 400 to 800 micrograms (mcg) of folic acid every day.  If you want to prevent pregnancy, talk to your health care provider about birth control (contraception). Osteoporosis and menopause  Osteoporosis is a disease in which the bones lose minerals and strength with aging. This can  result in serious bone fractures. Your risk for osteoporosis can be identified using a bone density scan.  If you are 7 years of age or older, or if you are at risk for osteoporosis and fractures, ask your health care provider if you should be screened.  Ask  your health care provider whether you should take a calcium or vitamin D supplement to lower your risk for osteoporosis.  Menopause may have certain physical symptoms and risks.  Hormone replacement therapy may reduce some of these symptoms and risks. Talk to your health care provider about whether hormone replacement therapy is right for you. Follow these instructions at home:  Schedule regular health, dental, and eye exams.  Stay current with your immunizations.  Do not use any tobacco products including cigarettes, chewing tobacco, or electronic cigarettes.  If you are pregnant, do not drink alcohol.  If you are breastfeeding, limit how much and how often you drink alcohol.  Limit alcohol intake to no more than 1 drink per day for nonpregnant women. One drink equals 12 ounces of beer, 5 ounces of wine, or 1 ounces of hard liquor.  Do not use street drugs.  Do not share needles.  Ask your health care provider for help if you need support or information about quitting drugs.  Tell your health care provider if you often feel depressed.  Tell your health care provider if you have ever been abused or do not feel safe at home. This information is not intended to replace advice given to you by your health care provider. Make sure you discuss any questions you have with your health care provider. Document Released: 10/05/2010 Document Revised: 08/28/2015 Document Reviewed: 12/24/2014 Elsevier Interactive Patient Education  [employer].

## 2017-09-05 NOTE — Assessment & Plan Note (Signed)
Check lipids 

## 2017-09-05 NOTE — Addendum Note (Signed)
Addended by: Docia Furl on: 09/05/2017 10:39 AM   Modules accepted: Orders

## 2017-09-05 NOTE — Progress Notes (Signed)
Patient: Laura Oneill, Female    DOB: 12-25-1950, 67 y.o.   MRN: [id number]  Visit Date: 09/05/2017  Today's Provider: Enid Derry, MD   Chief Complaint  Patient presents with  . Medicare Wellness    welcome to medicare    Subjective:   Laura Oneill is a 67 y.o. female who presents today for her Subsequent Annual Wellness Visit.  Patient is new to me; previous provider left our practice  USPSTF grade A and B recommendations Depression:  Depression screen [facility] 2/9 09/05/2017 08/25/2016 06/24/2015  Decreased Interest 0 0 0  Down, Depressed, Hopeless 0 0 0  PHQ - 2 Score 0 0 0   Hypertension: BP Readings from Last 3 Encounters:  09/05/17 122/82  08/25/16 108/76  06/24/15 100/62   Obesity: Wt Readings from Last 3 Encounters:  09/05/17 184 lb 1.6 oz (83.5 kg)  08/25/16 184 lb 11.2 oz (83.8 kg)  06/24/15 186 lb 3.2 oz (84.5 kg)   BMI Readings from Last 3 Encounters:  09/05/17 33.54 kg/m  08/25/16 30.74 kg/m  06/24/15 30.99 kg/m     Skin cancer: nothing worrisome Lung cancer:  Never smoker Breast cancer: no lumps, mammo UTD Colorectal cancer: has had colonoscopy in 2009; can't remember who did it; found a benign polyp; no fam hx of colon cancer Cervical cancer screening: UTD; not due until 2020 BRCA gene screening: family hx of breast and/or ovarian cancer and/or metastatic prostate cancer?  HIV, hep B, hep C: okay for HIV, already had Hep C screening STD testing and prevention (chl/gon/syphilis): n/a Intimate partner violence: no abuse Contraception: n/a Osteoporosis: osteopenia; she takes calcium with D; vitamin pill; could use more weight-bearing exercise Fall prevention/vitamin D: discussed Immunizations: reviewed; eligible for shingrix; PCV-13 today Diet: eats out occasionally; does cook lots of veggies Exercise: joined silver sneakers Alcohol: very seldom Tobacco use: never AAA: no Aspirin: not taking Glucose:  No hx of diabetes; no hx in the  family Glucose  Date Value Ref Range Status  06/27/2015 91 65 - 99 mg/dL Final   Glucose, Bld  Date Value Ref Range Status  08/25/2016 89 65 - 99 mg/dL Final   Lipids:  Lab Results  Component Value Date   CHOL 215 (H) 08/25/2016   CHOL 216 (H) 06/27/2015   Lab Results  Component Value Date   HDL 60 08/25/2016   HDL 60 06/27/2015   Lab Results  Component Value Date   LDLCALC 142 (H) 08/25/2016   LDLCALC 140 (H) 06/27/2015   Lab Results  Component Value Date   TRIG 65 08/25/2016   TRIG 80 06/27/2015   Lab Results  Component Value Date   CHOLHDL 3.6 08/25/2016   CHOLHDL 3.6 06/27/2015   No results found for: LDLDIRECT   Review of Systems  Constitutional: Negative for unexpected weight change.  HENT: Negative for nosebleeds.   Eyes: Negative for visual disturbance.  Respiratory: Negative for shortness of breath.   Cardiovascular: Negative for chest pain.  Gastrointestinal: Negative for blood in stool.  Endocrine: Negative for polydipsia.  Genitourinary: Negative for hematuria.  Musculoskeletal:       Carpal tunnel type pains; using brace at night and that helps  Skin:       No worrisome  Allergic/Immunologic: Negative for food allergies.  Neurological: Negative for tremors.  Hematological: Bruises/bleeds easily (nothing new).    Past Medical History:  Diagnosis Date  . Hyperlipidemia     History reviewed. No pertinent surgical history.  Family History  Problem Relation Age of Onset  . Hypothyroidism Mother   . COPD Father   . Breast cancer Neg Hx     Social History   Tobacco Use  . Smoking status: Never Smoker  . Smokeless tobacco: Never Used  Substance Use Topics  . Alcohol use: Yes    Alcohol/week: 0.0 oz    Comment: occasional  . Drug use: No    Outpatient Encounter Medications as of 09/05/2017  Medication Sig  . Multiple Vitamin (MULTIVITAMIN) tablet Take 1 tablet by mouth daily.  . Multiple Vitamins-Minerals (PRESERVISION AREDS 2 PO)  Take by mouth.   No facility-administered encounter medications on file as of 09/05/2017.     Functional Ability / Safety Screening 1.  Was the timed Get Up and Go test less than 12 seconds?  yes 2.  Does the patient need help with the phone, transportation, shopping,      preparing meals, housework, laundry, medications, or managing money?  no 3.  Does the patient's home have:  loose throw rugs in the hallway?   no      Grab bars in the bathroom? yes      Handrails on the stairs?   front but not back      Good lighting?   yes 4.  Has the patient noticed any hearing difficulties?   no  Advanced Directives Does patient have a HCPOA?    no If yes, name and contact information: Bobbye Petti, [phone] Does patient have a living will or MOST form?  no  Fall Risk Assessment Fall Risk  09/05/2017 08/25/2016 06/24/2015  Falls in the past year? No No No   Functional Status Survey: Is the patient deaf or have difficulty hearing?: No Does the patient have difficulty seeing, even when wearing glasses/contacts?: No Does the patient have difficulty concentrating, remembering, or making decisions?: No Does the patient have difficulty walking or climbing stairs?: No Does the patient have difficulty dressing or bathing?: No Does the patient have difficulty doing errands alone such as visiting a doctor's office or shopping?: No  See under rooming  Depression Screen See under rooming Depression screen [facility] 2/9 09/05/2017 08/25/2016 06/24/2015  Decreased Interest 0 0 0  Down, Depressed, Hopeless 0 0 0  PHQ - 2 Score 0 0 0    Objective:   Vitals: BP 122/82   Pulse 74   Temp 98.6 F (37 C) (Oral)   Resp 12   Ht 5' 2.13" (1.578 m)   Wt 184 lb 1.6 oz (83.5 kg)   SpO2 96%   BMI 33.54 kg/m  Body mass index is 33.54 kg/m. No exam data present  Physical Exam  Constitutional: She appears well-developed and well-nourished.  HENT:  Head: Normocephalic and atraumatic.  Right Ear: Hearing,  tympanic membrane, external ear and ear canal normal.  Left Ear: Hearing, tympanic membrane, external ear and ear canal normal.  Eyes: Conjunctivae and EOM are normal. Right eye exhibits no hordeolum. Left eye exhibits no hordeolum. No scleral icterus.  Neck: Carotid bruit is not present. No thyromegaly present.  Cardiovascular: Normal rate, regular rhythm, S1 normal, S2 normal and normal heart sounds.  No extrasystoles are present.  Pulmonary/Chest: Effort normal and breath sounds normal. No respiratory distress.  Pt politely declined breast exam by clinician; will get mammogram next month  Abdominal: Soft. Normal appearance and bowel sounds are normal. She exhibits no distension, no abdominal bruit, no pulsatile midline mass and no mass. There is no hepatosplenomegaly. There is no  tenderness. No hernia.  Musculoskeletal: Normal range of motion. She exhibits no edema.  Lymphadenopathy:       Head (right side): No submandibular adenopathy present.       Head (left side): No submandibular adenopathy present.    She has no cervical adenopathy.  Neurological: She is alert. She displays no tremor. No cranial nerve deficit. She exhibits normal muscle tone. Gait normal.  Reflex Scores:      Patellar reflexes are 2+ on the right side and 2+ on the left side. Skin: Skin is warm and dry. No bruising and no ecchymosis noted. No cyanosis. No pallor.  Psychiatric: Her speech is normal and behavior is normal. Thought content normal. Her mood appears not anxious. She does not exhibit a depressed mood.   Mood/affect:  Pleasant, euthymic Appearance:  Casually and neatly dressed, good hygiene  6CIT Screen 09/05/2017  What Year? 0 points  What month? 0 points  What time? 0 points  Count back from 20 0 points  Months in reverse 0 points  Repeat phrase 2 points  Total Score 2    Assessment & Plan:     Annual Wellness Visit  Reviewed patient's Family Medical History Reviewed and updated list of  patient's medical providers Assessment of cognitive impairment was done Assessed patient's functional ability Established a written schedule for health screening [place] Completed and Reviewed  Exercise Activities and Dietary recommendations Goals    . Weight (lb) < 160 lb (72.6 kg) (pt-stated)       Immunization History  Administered Date(s) Administered  . Influenza,inj,Quad PF,6+ Mos 12/25/2014, 01/22/2016, 01/10/2017  . Pneumococcal Conjugate-13 09/05/2017  . Zoster 12/25/2014    Health Maintenance  Topic Date Due  . HIV Screening  04/03/1967  . COLONOSCOPY  04/02/2002  . PNA vac Low Risk Adult (1 of 2 - PCV13) 04/02/2017  . TETANUS/TDAP  09/06/2018 (Originally 04/03/1971)  . MAMMOGRAM  10/25/2017  . INFLUENZA VACCINE  11/03/2017  . PAP SMEAR  06/24/2018  . DEXA SCAN  Completed  . Hepatitis C Screening  Completed    Discussed health benefits of physical activity, and encouraged her to engage in regular exercise appropriate for her age and condition.   No orders of the defined types were placed in this encounter.   Current Outpatient Medications:  Marland Kitchen  Multiple Vitamin (MULTIVITAMIN) tablet, Take 1 tablet by mouth daily., Disp: , Rfl:  .  Multiple Vitamins-Minerals (PRESERVISION AREDS 2 PO), Take by mouth., Disp: , Rfl:  There are no discontinued medications.  Next Medicare Wellness Visit in 12+ months  Problem List Items Addressed This Visit      Genitourinary   CKD (chronic kidney disease) stage 2, GFR 60-89 ml/min    Encouraged hydration, avoid NSAIDs      Relevant Orders   Basic Metabolic Panel (BMET)     Other   Medicare annual wellness visit, initial - Primary    USPSTF grade A and B recommendations reviewed with patient; age-appropriate recommendations, preventive care, screening tests, etc discussed and encouraged; healthy living encouraged; see AVS for patient education given to patient      Hyperlipidemia    Check lipids       Relevant Orders   Lipid panel    Other Visit Diagnoses    Screen for colon cancer       Relevant Orders   Ambulatory referral to Gastroenterology   Need for vaccination with 13-polyvalent pneumococcal conjugate vaccine  Relevant Orders   Pneumococcal conjugate vaccine 13-valent IM (Completed)   Encounter for screening for HIV       Relevant Orders   HIV antibody   Osteopenia of neck of femur, unspecified laterality       Relevant Orders   DG Bone Density   Visit for screening mammogram       Relevant Orders   MM DIGITAL SCREENING BILATERAL

## 2017-09-06 LAB — BASIC METABOLIC PANEL
BUN: 14 mg/dL (ref 7–25)
CALCIUM: 9.4 mg/dL (ref 8.6–10.4)
CO2: 27 mmol/L (ref 20–32)
Chloride: 106 mmol/L (ref 98–110)
Creat: 0.73 mg/dL (ref 0.50–0.99)
GLUCOSE: 90 mg/dL (ref 65–99)
Potassium: 4.1 mmol/L (ref 3.5–5.3)
Sodium: 140 mmol/L (ref 135–146)

## 2017-09-06 LAB — LIPID PANEL
Cholesterol: 236 mg/dL — ABNORMAL HIGH (ref <200)
HDL: 56 mg/dL (ref >50)
LDL CHOLESTEROL (CALC): 161 mg/dL — AB
NON-HDL CHOLESTEROL (CALC): 180 mg/dL — AB (ref <130)
Total CHOL/HDL Ratio: 4.2 (calc) (ref <5.0)
Triglycerides: 84 mg/dL (ref <150)

## 2017-09-06 LAB — HIV ANTIBODY (ROUTINE TESTING W REFLEX): HIV 1&2 Ab, 4th Generation: NONREACTIVE

## 2017-09-07 ENCOUNTER — Other Ambulatory Visit: Payer: Self-pay

## 2017-09-12 ENCOUNTER — Other Ambulatory Visit: Payer: Self-pay

## 2017-09-12 DIAGNOSIS — Z1211 Encounter for screening for malignant neoplasm of colon: Secondary | ICD-10-CM

## 2017-09-12 MED ORDER — PEG 3350-KCL-NA BICARB-NACL 420 G PO SOLR: 4000.0000 mL | Freq: Once | ORAL | 0 refills | Status: AC

## 2017-09-15 ENCOUNTER — Encounter: Payer: Self-pay | Admitting: Student

## 2017-09-15 ENCOUNTER — Encounter
Admission: RE | Disposition: A | Discharge status: Home / Self Care | Payer: Self-pay | Source: Ambulatory Visit | Attending: Gastroenterology

## 2017-09-15 ENCOUNTER — Other Ambulatory Visit: Payer: Self-pay

## 2017-09-15 ENCOUNTER — Ambulatory Visit: Payer: Medicare HMO | Admitting: Anesthesiology

## 2017-09-15 ENCOUNTER — Ambulatory Visit
Admission: RE | Admit: 2017-09-15 | Discharge: 2017-09-15 | Disposition: A | Discharge status: Home / Self Care | Payer: Medicare HMO | Source: Ambulatory Visit | Attending: Gastroenterology | Admitting: Gastroenterology

## 2017-09-15 DIAGNOSIS — D120 Benign neoplasm of cecum: Secondary | ICD-10-CM | POA: Diagnosis not present

## 2017-09-15 DIAGNOSIS — D123 Benign neoplasm of transverse colon: Secondary | ICD-10-CM | POA: Diagnosis not present

## 2017-09-15 DIAGNOSIS — K5730 Diverticulosis of large intestine without perforation or abscess without bleeding: Secondary | ICD-10-CM | POA: Insufficient documentation

## 2017-09-15 DIAGNOSIS — D124 Benign neoplasm of descending colon: Secondary | ICD-10-CM | POA: Diagnosis not present

## 2017-09-15 DIAGNOSIS — E785 Hyperlipidemia, unspecified: Secondary | ICD-10-CM | POA: Insufficient documentation

## 2017-09-15 DIAGNOSIS — D122 Benign neoplasm of ascending colon: Secondary | ICD-10-CM | POA: Diagnosis not present

## 2017-09-15 DIAGNOSIS — K635 Polyp of colon: Secondary | ICD-10-CM | POA: Diagnosis not present

## 2017-09-15 DIAGNOSIS — K5790 Diverticulosis of intestine, part unspecified, without perforation or abscess without bleeding: Secondary | ICD-10-CM | POA: Diagnosis not present

## 2017-09-15 DIAGNOSIS — Z1211 Encounter for screening for malignant neoplasm of colon: Secondary | ICD-10-CM

## 2017-09-15 HISTORY — PX: COLONOSCOPY WITH PROPOFOL: SHX5780

## 2017-09-15 SURGERY — COLONOSCOPY WITH PROPOFOL
Anesthesia: General

## 2017-09-15 MED ORDER — MIDAZOLAM HCL 2 MG/2ML IJ SOLN
INTRAMUSCULAR | Status: DC | PRN
  Administered 2017-09-15: 2 mg via INTRAVENOUS

## 2017-09-15 MED ORDER — PROPOFOL 500 MG/50ML IV EMUL
INTRAVENOUS | Status: AC
  Filled 2017-09-15: qty 50

## 2017-09-15 MED ORDER — MIDAZOLAM HCL 2 MG/2ML IJ SOLN
INTRAMUSCULAR | Status: AC
  Filled 2017-09-15: qty 2

## 2017-09-15 MED ORDER — SODIUM CHLORIDE 0.9 % IV SOLN
INTRAVENOUS | Status: DC
  Administered 2017-09-15 (×2): via INTRAVENOUS

## 2017-09-15 MED ORDER — PROPOFOL 500 MG/50ML IV EMUL
INTRAVENOUS | Status: DC | PRN
  Administered 2017-09-15: 100 ug/kg/min via INTRAVENOUS

## 2017-09-15 MED ORDER — PROPOFOL 10 MG/ML IV BOLUS
INTRAVENOUS | Status: DC | PRN
  Administered 2017-09-15: 40 mg via INTRAVENOUS

## 2017-09-15 NOTE — Transfer of Care (Signed)
Immediate Anesthesia Transfer of Care Note  Patient: Laura Oneill  Procedure(s) Performed: COLONOSCOPY WITH PROPOFOL (N/A )  Patient Location: PACU  Anesthesia Type:General  Level of Consciousness: sedated  Airway & Oxygen Therapy: Patient Spontanous Breathing and Patient connected to nasal cannula oxygen  Post-op Assessment: Report given to RN and Post -op Vital signs reviewed and stable  Post vital signs: Reviewed and stable  Last Vitals:  Vitals Value Taken Time  BP    Temp    Pulse    Resp    SpO2      Last Pain:  Vitals:   09/15/17 1051  TempSrc: Tympanic  PainSc: 0-No pain         Complications: No apparent anesthesia complications

## 2017-09-15 NOTE — Anesthesia Post-op Follow-up Note (Signed)
Anesthesia QCDR form completed.        

## 2017-09-15 NOTE — H&P (Signed)
Jonathon Bellows, MD [address] [place], [place], [place], [address] [address], [place], [place], [place], [address] Phone: [phone]  Fax: [phone]  Primary Care Physician:  Arnetha Courser, MD   Pre-Procedure History & Physical: HPI:  Laura Oneill is a 67 y.o. female is here for an colonoscopy.   Past Medical History:  Diagnosis Date  . Hyperlipidemia     History reviewed. No pertinent surgical history.  Prior to Admission medications   Medication Sig Start Date End Date Taking? Authorizing Provider  Multiple Vitamin (MULTIVITAMIN) tablet Take 1 tablet by mouth daily.    [provider]  Multiple Vitamins-Minerals (PRESERVISION AREDS 2 PO) Take by mouth.    [provider]    Allergies as of 09/06/2017 - Review Complete 09/05/2017  Allergen Reaction Noted  . Latex Rash 08/25/2016    Family History  Problem Relation Age of Onset  . Hypothyroidism Mother   . COPD Father   . Breast cancer Neg Hx     Social History   Socioeconomic History  . Marital status: Married    Spouse name: Not on file  . Number of children: Not on file  . Years of education: Not on file  . Highest education level: Not on file  Occupational History  . Not on file  Social Needs  . Financial resource strain: Not on file  . Food insecurity:    Worry: Not on file    Inability: Not on file  . Transportation needs:    Medical: Not on file    Non-medical: Not on file  Tobacco Use  . Smoking status: Never Smoker  . Smokeless tobacco: Never Used  Substance and Sexual Activity  . Alcohol use: Yes    Alcohol/week: 0.0 oz    Comment: occasional  . Drug use: No  . Sexual activity: Yes  Lifestyle  . Physical activity:    Days per week: Not on file    Minutes per session: Not on file  . Stress: Not on file  Relationships  . Social connections:    Talks on phone: Not on file    Gets together: Not on file    Attends religious service: Not on file   Active member of club or organization: Not on file    Attends meetings of clubs or organizations: Not on file    Relationship status: Not on file  . Intimate partner violence:    Fear of current or ex partner: Not on file    Emotionally abused: Not on file    Physically abused: Not on file    Forced sexual activity: Not on file  Other Topics Concern  . Not on file  Social History Narrative  . Not on file    Review of Systems: See HPI, otherwise negative ROS  Physical Exam: BP 115/75   Pulse (!) 55   Temp (!) 96.8 F (36 C) (Tympanic)   Resp 16   Ht 5\' 2"  (1.575 m)   Wt 180 lb (81.6 kg)   SpO2 100%   BMI 32.92 kg/m  General:   Alert,  pleasant and cooperative in NAD Head:  Normocephalic and atraumatic. Neck:  Supple; no masses or thyromegaly. Lungs:  Clear throughout to auscultation, normal respiratory effort.    Heart:  +S1, +S2, Regular rate and rhythm, No edema. Abdomen:  Soft, nontender and nondistended. Normal bowel sounds, without guarding, and without rebound.   Neurologic:  Alert and  oriented  x4;  grossly normal neurologically.  Impression/Plan: Laura Oneill is here for an colonoscopy to be performed for Screening colonoscopy average risk   Risks, benefits, limitations, and alternatives regarding  colonoscopy have been reviewed with the patient.  Questions have been answered.  All parties agreeable.   Jonathon Bellows, MD  09/15/2017, 11:07 AM

## 2017-09-15 NOTE — Op Note (Signed)
[facility] Gastroenterology Patient Name: Laura Oneill Procedure Date: 09/15/2017 11:14 AM MRN: [id number] Account #: [id number] Date of Birth: 1950/08/28 Admit Type: Outpatient Age: 67 Room: [facility] ENDO ROOM 4 Gender: Female Note Status: Finalized Procedure:            Colonoscopy Indications:          Screening for colorectal malignant neoplasm Providers:            Jonathon Bellows MD, MD Referring MD:         Arnetha Courser (Referring MD) Medicines:            Monitored Anesthesia Care Complications:        No immediate complications. Procedure:            Pre-Anesthesia Assessment:                       - Prior to the procedure, a History and Physical was                        performed, and patient medications, allergies and                        sensitivities were reviewed. The patient's tolerance of                        previous anesthesia was reviewed.                       - The risks and benefits of the procedure and the                        sedation options and risks were discussed with the                        patient. All questions were answered and informed                        consent was obtained.                       - ASA Grade Assessment: II - A patient with mild                        systemic disease.                       After obtaining informed consent, the colonoscope was                        passed under direct vision. Throughout the procedure,                        the patient's blood pressure, pulse, and oxygen                        saturations were monitored continuously. The                        Colonoscope was introduced through the anus and  advanced to the the cecum, identified by the                        appendiceal orifice, IC valve and transillumination.                        The colonoscopy was performed with ease. The patient                        tolerated the procedure well. The  quality of the bowel                        preparation was good. Findings:      Three sessile polyps were found in the transverse colon, ascending colon       and cecum. The polyps were 4 to 6 mm in size. These polyps were removed       with a cold snare. Resection and retrieval were complete.      A few small-mouthed diverticula were found in the sigmoid colon.      The exam was otherwise without abnormality on direct and retroflexion       views. Impression:           - Three 4 to 6 mm polyps in the transverse colon, in                        the ascending colon and in the cecum, removed with a                        cold snare. Resected and retrieved.                       - Diverticulosis in the sigmoid colon.                       - The examination was otherwise normal on direct and                        retroflexion views. Recommendation:       - Discharge patient to home (with escort).                       - Resume previous diet.                       - Continue present medications.                       - Await pathology results.                       - Repeat colonoscopy in 3 - 5 years for surveillance. Procedure Code(s):    --- Professional ---                       [phone], Colonoscopy, flexible; with removal of tumor(s),                        polyp(s), or other lesion(s) by snare technique Diagnosis Code(s):    --- Professional ---  Z12.11, Encounter for screening for malignant neoplasm                        of colon                       D12.3, Benign neoplasm of transverse colon (hepatic                        flexure or splenic flexure)                       D12.2, Benign neoplasm of ascending colon                       D12.0, Benign neoplasm of cecum                       K57.30, Diverticulosis of large intestine without                        perforation or abscess without bleeding CPT copyright 2017 American Medical Association. All rights  reserved. The codes documented in this report are preliminary and upon coder review may  be revised to meet current compliance requirements. Jonathon Bellows, MD Jonathon Bellows MD, MD 09/15/2017 11:39:39 AM This report has been signed electronically. Number of Addenda: 0 Note Initiated On: 09/15/2017 11:14 AM Scope Withdrawal Time: 0 hours 13 minutes 18 seconds  Total Procedure Duration: 0 hours 17 minutes 27 seconds       [facility]

## 2017-09-15 NOTE — Anesthesia Preprocedure Evaluation (Signed)
Anesthesia Evaluation  Patient identified by MRN, date of birth, ID band Patient awake    Reviewed: Allergy & Precautions, H&P , NPO status , Patient's Chart, lab work & pertinent test results, reviewed documented beta blocker date and time   Airway Mallampati: II   Neck ROM: full    Dental  (+) Poor Dentition   Pulmonary neg pulmonary ROS,    Pulmonary exam normal        Cardiovascular negative cardio ROS Normal cardiovascular exam Rhythm:regular Rate:Normal     Neuro/Psych negative neurological ROS  negative psych ROS   GI/Hepatic negative GI ROS, Neg liver ROS,   Endo/Other  negative endocrine ROS  Renal/GU Renal diseasenegative Renal ROS  negative genitourinary   Musculoskeletal   Abdominal   Peds  Hematology negative hematology ROS (+)   Anesthesia Other Findings Past Medical History: No date: Hyperlipidemia History reviewed. No pertinent surgical history. BMI    Body Mass Index:  32.92 kg/m     Reproductive/Obstetrics negative OB ROS                             Anesthesia Physical Anesthesia Plan  ASA: II  Anesthesia Plan: General   Post-op Pain Management:    Induction:   PONV Risk Score and Plan:   Airway Management Planned:   Additional Equipment:   Intra-op Plan:   Post-operative Plan:   Informed Consent: I have reviewed the patients History and Physical, chart, labs and discussed the procedure including the risks, benefits and alternatives for the proposed anesthesia with the patient or authorized representative who has indicated his/her understanding and acceptance.   Dental Advisory Given  Plan Discussed with: CRNA  Anesthesia Plan Comments:         Anesthesia Quick Evaluation

## 2017-09-17 LAB — SURGICAL PATHOLOGY

## 2017-09-18 ENCOUNTER — Encounter: Payer: Self-pay | Admitting: Gastroenterology

## 2017-09-20 DIAGNOSIS — R69 Illness, unspecified: Secondary | ICD-10-CM | POA: Diagnosis not present

## 2017-09-21 ENCOUNTER — Encounter: Payer: Self-pay | Admitting: Gastroenterology

## 2017-09-22 NOTE — Anesthesia Postprocedure Evaluation (Signed)
Anesthesia Post Note  Patient: Laura Oneill  Procedure(s) Performed: COLONOSCOPY WITH PROPOFOL (N/A )  Patient location during evaluation: PACU Anesthesia Type: General Level of consciousness: awake and alert Pain management: pain level controlled Vital Signs Assessment: post-procedure vital signs reviewed and stable Respiratory status: spontaneous breathing, nonlabored ventilation, respiratory function stable and patient connected to nasal cannula oxygen Cardiovascular status: blood pressure returned to baseline and stable Postop Assessment: no apparent nausea or vomiting Anesthetic complications: no     Last Vitals:  Vitals:   09/15/17 1051 09/15/17 1142  BP: 115/75 110/73  Pulse: (!) 55   Resp: 16 14  Temp: (!) 36 C (!) 36 C  SpO2: 100%     Last Pain:  Vitals:   09/16/17 0751  TempSrc:   PainSc: 0-No pain                 Molli Barrows

## 2017-10-27 ENCOUNTER — Other Ambulatory Visit: Payer: Medicare HMO

## 2017-11-01 ENCOUNTER — Ambulatory Visit
Admission: RE | Admit: 2017-11-01 | Discharge: 2017-11-01 | Disposition: A | Discharge status: Home / Self Care | Payer: Medicare HMO | Source: Ambulatory Visit | Attending: Family Medicine | Admitting: Family Medicine

## 2017-11-01 DIAGNOSIS — M85851 Other specified disorders of bone density and structure, right thigh: Secondary | ICD-10-CM | POA: Diagnosis not present

## 2017-11-01 DIAGNOSIS — Z1231 Encounter for screening mammogram for malignant neoplasm of breast: Secondary | ICD-10-CM | POA: Diagnosis not present

## 2017-11-01 DIAGNOSIS — M85859 Other specified disorders of bone density and structure, unspecified thigh: Secondary | ICD-10-CM | POA: Diagnosis present

## 2017-11-01 DIAGNOSIS — Z780 Asymptomatic menopausal state: Secondary | ICD-10-CM | POA: Diagnosis not present

## 2017-11-04 ENCOUNTER — Encounter: Payer: Self-pay | Admitting: Family Medicine

## 2017-11-04 DIAGNOSIS — M8580 Other specified disorders of bone density and structure, unspecified site: Secondary | ICD-10-CM

## 2017-11-04 HISTORY — DX: Other specified disorders of bone density and structure, unspecified site: M85.80

## 2017-11-14 ENCOUNTER — Encounter: Payer: Self-pay | Admitting: Family Medicine

## 2017-11-14 ENCOUNTER — Ambulatory Visit (INDEPENDENT_AMBULATORY_CARE_PROVIDER_SITE_OTHER): Payer: Medicare HMO | Admitting: Family Medicine

## 2017-11-14 VITALS — BP 112/78 | HR 69 | Temp 98.3°F | Ht 62.0 in | Wt 181.1 lb

## 2017-11-14 DIAGNOSIS — M7062 Trochanteric bursitis, left hip: Secondary | ICD-10-CM

## 2017-11-14 DIAGNOSIS — N182 Chronic kidney disease, stage 2 (mild): Secondary | ICD-10-CM | POA: Diagnosis not present

## 2017-11-14 DIAGNOSIS — E782 Mixed hyperlipidemia: Secondary | ICD-10-CM

## 2017-11-14 MED ORDER — MELOXICAM 7.5 MG PO TABS: ORAL_TABLET | ORAL | 0 refills | Status: DC

## 2017-11-14 NOTE — Assessment & Plan Note (Signed)
She will continue diet changes and see if she can bring her LDL down without a statin at this time

## 2017-11-14 NOTE — Progress Notes (Signed)
BP 112/78   Pulse 69   Temp 98.3 F (36.8 C)   Ht 5\' 2"  (1.575 m)   Wt 181 lb 1.6 oz (82.1 kg)   SpO2 97%   BMI 33.12 kg/m    Subjective:    Patient ID: Laura Oneill, female    DOB: 02/28/1951, 67 y.o.   MRN: [id number]  HPI: Laura Oneill is a 67 y.o. female  Chief Complaint  Patient presents with  . Leg Pain    Left upper thigh, onset last wednesday, intermittent    HPI Patient is here for f/u About a week ago, she had lower back pain; just a feeling like overworked muscles; no shooting pains; just real tired feeling; completely went away Then last Wednesday, nagging pain in the upper thigh; especially standing it would hurt Walking would hurt some but could walk out the pain Like toothache in the bone, deep in the bone Husband has sciatica, and wonders if that might be it No pain in the hip but squatting caused pain going down Cold compresses helps; TENS unit helps; no problems in the morning or laying down; once moving around, starts to bother her; no leg swelling No loss of control B/B No rash like shingles  High cholesterol; she is trying diet and fish oil and more grains; fish twice a week; LDL 161; trying to lose weight on her own; she would rather not take a statin  Depression screen [facility] 2/9 11/14/2017 09/05/2017 08/25/2016 06/24/2015  Decreased Interest 0 0 0 0  Down, Depressed, Hopeless 0 0 0 0  PHQ - 2 Score 0 0 0 0    Relevant past medical, surgical, family and social history reviewed Past Medical History:  Diagnosis Date  . Hyperlipidemia   . Osteopenia 11/04/2017   July 2019   Past Surgical History:  Procedure Laterality Date  . COLONOSCOPY WITH PROPOFOL N/A 09/15/2017   Procedure: COLONOSCOPY WITH PROPOFOL;  Surgeon: Jonathon Bellows, MD;  Location: [facility] ENDOSCOPY;  Service: Gastroenterology;  Laterality: N/A;   Family History  Problem Relation Age of Onset  . Hypothyroidism Mother   . COPD Father   . Breast cancer Neg Hx    Social History    Tobacco Use  . Smoking status: Never Smoker  . Smokeless tobacco: Never Used  Substance Use Topics  . Alcohol use: Yes    Alcohol/week: 0.0 standard drinks    Comment: occasional  . Drug use: No    Interim medical history since last visit reviewed. Allergies and medications reviewed  Review of Systems Per HPI unless specifically indicated above     Objective:    BP 112/78   Pulse 69   Temp 98.3 F (36.8 C)   Ht 5\' 2"  (1.575 m)   Wt 181 lb 1.6 oz (82.1 kg)   SpO2 97%   BMI 33.12 kg/m   Wt Readings from Last 3 Encounters:  11/14/17 181 lb 1.6 oz (82.1 kg)  09/15/17 180 lb (81.6 kg)  09/05/17 184 lb 1.6 oz (83.5 kg)    Physical Exam  Constitutional: She appears well-developed and well-nourished.  HENT:  Mouth/Throat: Mucous membranes are normal.  Eyes: EOM are normal. No scleral icterus.  Cardiovascular: Normal rate and regular rhythm.  Pulmonary/Chest: Effort normal and breath sounds normal.  Musculoskeletal:       Left hip: She exhibits tenderness (over LEFT greater trochanter). She exhibits normal range of motion, normal strength and no swelling.       Left  upper leg: She exhibits no tenderness, no swelling and no deformity.       Right lower leg: She exhibits no swelling and no edema.       Left lower leg: She exhibits no swelling and no edema.  Neurological:  LE strength 5/5; normal gait  Psychiatric: She has a normal mood and affect. Her behavior is normal.    Lab Results  Component Value Date   CHOL 236 (H) 09/05/2017   HDL 56 09/05/2017   LDLCALC 161 (H) 09/05/2017   TRIG 84 09/05/2017   CHOLHDL 4.2 09/05/2017       Assessment & Plan:   Problem List Items Addressed This Visit      Genitourinary   CKD (chronic kidney disease) stage 2, GFR 60-89 ml/min    Very short course of meloxicam at lower dose; do NOT use other NSAIDs        Other   Hyperlipidemia    She will continue diet changes and see if she can bring her LDL down without a  statin at this time       Other Visit Diagnoses    Trochanteric bursitis of left hip    -  Primary   suspect inflammation; will treat with meloxicam; if not improving or getting worse, let me know; doubt sciatica; nothing to suggest DVT I explained       Follow up plan: No follow-ups on file.  An after-visit summary was printed and given to the patient at [place].  Please see the patient instructions which may contain other information and recommendations beyond what is mentioned above in the assessment and plan.  Meds ordered this encounter  Medications  . meloxicam (MOBIC) 7.5 MG tablet    Sig: Take one pill daily by mouth with food for 10 days, then just daily as needed    Dispense:  30 tablet    Refill:  0    No orders of the defined types were placed in this encounter.

## 2017-11-14 NOTE — Patient Instructions (Signed)
Try the new medicine Feel free to use ice over the area 15-20 minutes at a time, 3-4 x a day Okay to use tylenol per package directions Call me in 2 weeks or so with update if not getting better

## 2017-11-14 NOTE — Assessment & Plan Note (Signed)
Very short course of meloxicam at lower dose; do NOT use other NSAIDs

## 2017-12-06 ENCOUNTER — Ambulatory Visit: Payer: Self-pay | Admitting: Family Medicine

## 2017-12-06 ENCOUNTER — Encounter: Payer: Self-pay | Admitting: Nurse Practitioner

## 2017-12-06 ENCOUNTER — Ambulatory Visit (INDEPENDENT_AMBULATORY_CARE_PROVIDER_SITE_OTHER): Payer: Medicare HMO | Admitting: Nurse Practitioner

## 2017-12-06 VITALS — BP 110/70 | HR 65 | Temp 98.2°F | Resp 16 | Ht 62.0 in | Wt 180.0 lb

## 2017-12-06 DIAGNOSIS — M5432 Sciatica, left side: Secondary | ICD-10-CM

## 2017-12-06 MED ORDER — TIZANIDINE HCL 2 MG PO CAPS: 2.0000 mg | ORAL_CAPSULE | Freq: Every day | ORAL | 0 refills | Status: DC

## 2017-12-06 MED ORDER — MELOXICAM 7.5 MG PO TABS: ORAL_TABLET | ORAL | 0 refills | Status: DC

## 2017-12-06 NOTE — Patient Instructions (Addendum)
- Look up exercises for piriformis muscle and sciatic nerve stretches ([employer] on youtube) - take meloxicam with meals.  -Continue with ice and heat and start stretching - Can refer to PT and Ortho if needed please call back .   Spondylolisthesis Rehab Ask your health care provider which exercises are safe for you. Do exercises exactly as told by your health care provider and adjust them as directed. It is normal to feel mild stretching, pulling, tightness, or discomfort as you do these exercises, but you should stop right away if you feel sudden pain or your pain gets worse. Do not begin these exercises until told by your health care provider. Stretching and range of motion exercises These exercises warm up your muscles and joints and improve the movement and flexibility of your hips and your back. These exercises may also help to relieve pain, numbness, and tingling. Exercise A: Single knee to chest  1. Lie on your back on a firm surface with both legs straight. 2. Bend one of your knees. Use your hands to move your knee up toward your chest until you feel a gentle stretch in your lower back and buttock. ? Hold your leg in this position by holding onto the front of your knee. ? Keep your other leg as straight as possible. 3. Hold for __________ seconds. 4. Slowly return to the starting position. 5. Repeat this exercise with your other leg. Repeat __________ times. Complete this exercise __________ times a day. Exercise B: Double knee to chest  1. Lie on your back on a firm surface with both legs straight. 2. Bend one of your knees and move it toward your chest until you feel a gentle stretch in your lower back and buttock. 3. Tense your abdominal muscles and repeat the previous step with your other leg. 4. Hold both of your legs in this position by holding onto the backs of your thighs or the fronts of your knees. 5. Hold for __________ seconds. 6. Tense your abdominal muscles  and slowly move your legs back to the floor, one leg at a time. Repeat __________ times. Complete this exercise __________ times a day. Strengthening exercises These exercises build strength and endurance in your back. Endurance is the ability to use your muscles for a long time, even after they get tired. Exercise C: Pelvic tilt 1. Lie on your back on a firm bed or the floor. Bend your knees and keep your feet flat. 2. Tense your abdominal muscles. Tip your pelvis up toward the ceiling and flatten your lower back into the floor. ? To help with this exercise, you may place a small towel under your lower back and try to push your back into the towel. 3. Hold for __________ seconds. 4. Let your muscles relax completely before you repeat this exercise. Repeat __________ times. Complete this exercise __________ times a day. Exercise D: Abdominal crunch  1. Lie on your back on a firm surface. Bend your knees and keep your feet flat. Cross your arms over your chest. 2. Tuck your chin down toward your chest, without bending your neck. 3. Use your abdominal muscles to lift your upper body off of the ground, straight up into the air. ? Try to lift yourself until your shoulder blades are off the ground. You may need to work up to this. ? Keep your lower back on the ground while you crunch upward. ? Do not hold your breath. 4. Slowly lower yourself down.  Keep your abdominal muscles tense until you are back to the starting position. Repeat __________ times. Complete this exercise __________ times a day. Exercise E: Alternating arm and leg raises  1. Get on your hands and knees on a firm surface. If you are on a hard floor, you may want to use padding to cushion your knees, such as an exercise mat. 2. Line up your arms and legs. Your hands should be below your shoulders, and your knees should be below your hips. 3. Lift your left leg behind you. At the same time, raise your right arm and straighten it in  front of you. ? Do not lift your leg higher than your hip. ? Do not lift your arm higher than your shoulder. ? Keep your abdominal and back muscles tight. ? Keep your hips facing the ground. ? Do not arch your back. ? Keep your balance carefully, and do not hold your breath. 4. Hold for __________ seconds. 5. Slowly return to the starting position and repeat with your right leg and your left arm. Repeat __________ times. Complete this exercise __________ times a day. Posture and body mechanics  Body mechanics refers to the movements and positions of your body while you do your daily activities. Posture is part of body mechanics. Good posture and healthy body mechanics can help to relieve stress in your body's tissues and joints. Good posture means that your spine is in its natural S-curve position (your spine is neutral), your shoulders are pulled back slightly, and your head is not tipped forward. The following are general guidelines for applying improved posture and body mechanics to your everyday activities. Standing   When standing, keep your spine neutral and your feet about hip-width apart. Keep a slight bend in your knees. Your ears, shoulders, and hips should line up.  When you do a task in which you stand in one place for a long time, place one foot up on a stable object that is 2-4 inches (5-10 cm) high, such as a footstool. This helps keep your spine neutral. Sitting   When sitting, keep your spine neutral and keep your feet flat on the floor. Use a footrest, if necessary, and keep your thighs parallel to the floor. Avoid rounding your shoulders, and avoid tilting your head forward.  When working at a desk or a computer, keep your desk at a height where your hands are slightly lower than your elbows. Slide your chair under your desk so you are close enough to maintain good posture.  When working at a computer, place your monitor at a height where you are looking straight ahead  and you do not have to tilt your head forward or downward to look at the screen. Resting  When lying down and resting, avoid positions that are most painful for you.  If you have pain with activities such as sitting, bending, stooping, or squatting (flexion-based activities), lie in a position in which your body does not bend very much. For example, avoid curling up on your side with your arms and knees near your chest (fetal position).  If you have pain with activities such as standing for a long time or reaching with your arms (extension-based activities), lie with your spine in a neutral position and bend your knees slightly. Try the following positions: ? Lying on your side with a pillow between your knees. ? Lying on your back with a pillow under your knees.  Lifting   When lifting objects, keep  your feet at least shoulder-width apart and tighten your abdominal muscles.  Bend your knees and hips and keep your spine neutral. It is important to lift using the strength of your legs, not your back. Do not lock your knees straight out.  Always ask for help to lift heavy or awkward objects. This information is not intended to replace advice given to you by your health care provider. Make sure you discuss any questions you have with your health care provider. Document Released: 03/22/2005 Document Revised: 11/27/2015 Document Reviewed: 12/31/2014 Elsevier Interactive Patient Education  [employer].

## 2017-12-06 NOTE — Telephone Encounter (Signed)
Pt was seen in the office on 11/14/2017 for leg pain she states the leg hasnt gotten any better and she is starting to have numbness with sharp pain running down leg  Call to patient- she was told to follow up with her leg pain if no better- it is not better- she would like to go ahead and see what to do next- she is starting to have numbness.  Reason for Disposition . Numbness in a leg or foot (i.e., loss of sensation)  Answer Assessment - Initial Assessment Questions 1. ONSET: "When did the pain start?"      Before 8/12- patient was seen in the office 2. LOCATION: "Where is the pain located?"      Left- upper hip- some pressure above knee 3. PAIN: "How bad is the pain?"    (Scale 1-10; or mild, moderate, severe)   -  MILD (1-3): doesn't interfere with normal activities    -  MODERATE (4-7): interferes with normal activities (e.g., work or school) or awakens from sleep, limping    -  SEVERE (8-10): excruciating pain, unable to do any normal activities, unable to walk     Sitting-1 Standing is worse than walking- 7-8 4. WORK OR EXERCISE: "Has there been any recent work or exercise that involved this part of the body?"      n/a  5. CAUSE: "What do you think is causing the leg pain?"     Inflammation- antiinflammatory did not help symptoms- patient has stopped medication - last Thursday  6. OTHER SYMPTOMS: "Do you have any other symptoms?" (e.g., chest pain, back pain, breathing difficulty, swelling, rash, fever, numbness, weakness)     Back pain, numbness in the leg 7. PREGNANCY: "Is there any chance you are pregnant?" "When was your last menstrual period?"     n/a  Protocols used: LEG PAIN-A-AH

## 2017-12-06 NOTE — Progress Notes (Signed)
Name: Laura Oneill   MRN: [id number]    DOB: Aug 27, 1950   Date:12/06/2017       Progress Note  Subjective  Chief Complaint  Chief Complaint  Patient presents with  . Leg Pain    x 3 weeks, pain radiates to left knee. Sitting correctly resolves pain. Pain is better when walking more than standing.     HPI  States was seen for same on 8/12 by PCP Dr. Sanda Klein. Has been having lower back pain shoots down left leg and feels lots of pressure on knee after she has walked a while. Standing in one position causes pain to be very achey pain. Ice and sitting helps pain. Tried 10 days of meloxicam without relief. OTC TENS unit bought but wasn't helping much. Has some paresthesias from left hip down to left knee. Medial aspect of leg has least amount of symptoms. No swelling, no bowel, bladder incontinence.   Patient Active Problem List   Diagnosis Date Noted  . Osteopenia 11/04/2017  . Medicare annual wellness visit, initial 09/05/2017  . CKD (chronic kidney disease) stage 2, GFR 60-89 ml/min 09/05/2017  . Hyperlipidemia 08/25/2016  . Well woman exam without gynecological exam 06/24/2015  . Screening for breast cancer 06/24/2015  . Post-menopausal 06/24/2015    Past Medical History:  Diagnosis Date  . Hyperlipidemia   . Osteopenia 11/04/2017   July 2019    Past Surgical History:  Procedure Laterality Date  . COLONOSCOPY WITH PROPOFOL N/A 09/15/2017   Procedure: COLONOSCOPY WITH PROPOFOL;  Surgeon: Jonathon Bellows, MD;  Location: [facility] ENDOSCOPY;  Service: Gastroenterology;  Laterality: N/A;    Social History   Tobacco Use  . Smoking status: Never Smoker  . Smokeless tobacco: Never Used  Substance Use Topics  . Alcohol use: Yes    Alcohol/week: 0.0 standard drinks    Comment: occasional     Current Outpatient Medications:  Marland Kitchen  Multiple Vitamin (MULTIVITAMIN) tablet, Take 1 tablet by mouth daily., Disp: , Rfl:  .  Multiple Vitamins-Minerals (PRESERVISION AREDS 2 PO), Take by mouth.,  Disp: , Rfl:  .  Omega-3 Fatty Acids (FISH OIL) 1000 MG CAPS, Take 2 capsules by mouth daily. , Disp: , Rfl:  .  meloxicam (MOBIC) 7.5 MG tablet, Take one pill daily by mouth with food for 10 days, then just daily as needed (Patient not taking: Reported on 12/06/2017), Disp: 30 tablet, Rfl: 0  Allergies  Allergen Reactions  . Latex Rash    Review of Systems  Eyes: Negative for blurred vision and double vision.  Respiratory: Negative for cough.   Cardiovascular: Negative for chest pain and palpitations.  Musculoskeletal: Positive for back pain and myalgias. Negative for falls and neck pain.  Neurological: Positive for sensory change (constant numbness in left leg that worsens at times). Negative for dizziness, tingling and headaches.    No other specific complaints in a complete review of systems (except as listed in HPI above).  Objective  Vitals:   12/06/17 1053  BP: 110/70  Pulse: 65  Resp: 16  Temp: 98.2 F (36.8 C)  TempSrc: Oral  SpO2: 97%  Weight: 180 lb (81.6 kg)  Height: 5\' 2"  (1.575 m)    Body mass index is 32.92 kg/m.  Nursing Note and Vital Signs reviewed.  Physical Exam  Constitutional: She is oriented to person, place, and time. She appears well-developed and well-nourished.  HENT:  Head: Normocephalic and atraumatic.  Neck: Normal range of motion.  Cardiovascular: Normal rate.  Pulmonary/Chest:  Effort normal.  Musculoskeletal: She exhibits no edema or deformity.       Arms:      Right upper leg: Normal. She exhibits no tenderness, no bony tenderness, no swelling, no edema and no deformity.       Left upper leg: She exhibits no tenderness, no bony tenderness, no swelling, no edema and no deformity.       Legs: positive straight leg test with left leg  Neurological: She is alert and oriented to person, place, and time. Coordination normal.  Skin: Skin is warm and dry.  Psychiatric: She has a normal mood and affect. Her behavior is normal. Judgment and  thought content normal.       No results found for this or any previous visit (from the past 48 hour(s)).  Assessment & Plan  1. Back pain with left-sided sciatica -Continue with ice and heat and start stretching; Can refer to PT and Ortho if needed please call back . - meloxicam (MOBIC) 7.5 MG tablet; Take one pill daily by mouth with food for 10 days, then just daily as needed  Dispense: 30 tablet; Refill: 0 - tizanidine (ZANAFLEX) 2 MG capsule; Take 1 capsule (2 mg total) by mouth at bedtime. Can take additional dose during the day time if needed.  Dispense: 15 capsule; Refill: 0  -Follow up and care instructions discussed and provided in AVS. -Reviewed Health Maintenance: declines flu shot

## 2017-12-22 ENCOUNTER — Encounter: Payer: Self-pay | Admitting: Family Medicine

## 2017-12-22 ENCOUNTER — Ambulatory Visit (INDEPENDENT_AMBULATORY_CARE_PROVIDER_SITE_OTHER): Payer: Medicare HMO | Admitting: Family Medicine

## 2017-12-22 DIAGNOSIS — E782 Mixed hyperlipidemia: Secondary | ICD-10-CM | POA: Diagnosis not present

## 2017-12-22 DIAGNOSIS — E669 Obesity, unspecified: Secondary | ICD-10-CM | POA: Diagnosis not present

## 2017-12-22 DIAGNOSIS — N182 Chronic kidney disease, stage 2 (mild): Secondary | ICD-10-CM | POA: Diagnosis not present

## 2017-12-22 DIAGNOSIS — K635 Polyp of colon: Secondary | ICD-10-CM | POA: Diagnosis not present

## 2017-12-22 LAB — LIPID PANEL
CHOLESTEROL: 241 mg/dL — AB (ref <200)
HDL: 64 mg/dL (ref >50)
LDL CHOLESTEROL (CALC): 159 mg/dL — AB
NON-HDL CHOLESTEROL (CALC): 177 mg/dL — AB (ref <130)
Total CHOL/HDL Ratio: 3.8 (calc) (ref <5.0)
Triglycerides: 75 mg/dL (ref <150)

## 2017-12-22 NOTE — Assessment & Plan Note (Signed)
Avoid NSAIDs; stay hydrated 

## 2017-12-22 NOTE — Progress Notes (Signed)
BP 124/78   Pulse 72   Temp 98.2 F (36.8 C)   Ht 5\' 2"  (1.575 m)   Wt 181 lb 9.6 oz (82.4 kg)   SpO2 97%   BMI 33.22 kg/m    Subjective:    Patient ID: Laura Oneill, female    DOB: 11/16/1950, 67 y.o.   MRN: [id number]  HPI: DUSTI TETRO is a 67 y.o. female  Chief Complaint  Patient presents with  . Follow-up    HPI Patient is here for f/u High cholesterol; her last lipids were in June She did not want to take a statin She is eating oatmeal and cereal instead of bacon and eggs; she does have that once in a while, still in the rotation Not much cheese, not much at all Maybe 2 eggs a week She says the exercise has been a challenge because of the leg; not as bad at times; sometimes hurts, sometiimes okay; still a little numb in the front; trying to do some stretches they told her to do; just haven't  Lab Results  Component Value Date   CHOL 236 (H) 09/05/2017   HDL 56 09/05/2017   LDLCALC 161 (H) 09/05/2017   TRIG 84 09/05/2017   CHOLHDL 4.2 09/05/2017   CKD stage 2 Not taking Rx NSAID  Obesity; knows what to do; exercise is a challenge; drinking lots more water since last visit  HM list; had colonoscopy and had precancerous type of polyp; she will be going back in 3 years  Depression screen [facility] 2/9 12/22/2017 12/06/2017 11/14/2017 09/05/2017 08/25/2016  Decreased Interest 0 0 0 0 0  Down, Depressed, Hopeless 0 0 0 0 0  PHQ - 2 Score 0 0 0 0 0    Relevant past medical, surgical, family and social history reviewed Past Medical History:  Diagnosis Date  . Hyperlipidemia   . Osteopenia 11/04/2017   July 2019   Past Surgical History:  Procedure Laterality Date  . COLONOSCOPY WITH PROPOFOL N/A 09/15/2017   Procedure: COLONOSCOPY WITH PROPOFOL;  Surgeon: Jonathon Bellows, MD;  Location: [facility];  Service: Gastroenterology;  Laterality: N/A;   Family History  Problem Relation Age of Onset  . Hypothyroidism Mother   . COPD Father   . Breast cancer Neg Hx     Social History   Tobacco Use  . Smoking status: Never Smoker  . Smokeless tobacco: Never Used  Substance Use Topics  . Alcohol use: Yes    Alcohol/week: 0.0 standard drinks    Comment: occasional  . Drug use: No    Interim medical history since last visit reviewed. Allergies and medications reviewed  Review of Systems Per HPI unless specifically indicated above     Objective:    BP 124/78   Pulse 72   Temp 98.2 F (36.8 C)   Ht 5\' 2"  (1.575 m)   Wt 181 lb 9.6 oz (82.4 kg)   SpO2 97%   BMI 33.22 kg/m   Wt Readings from Last 3 Encounters:  12/22/17 181 lb 9.6 oz (82.4 kg)  12/06/17 180 lb (81.6 kg)  11/14/17 181 lb 1.6 oz (82.1 kg)    Physical Exam  Constitutional: She appears well-developed and well-nourished.  obese  HENT:  Mouth/Throat: Mucous membranes are normal.  Eyes: EOM are normal. No scleral icterus.  Cardiovascular: Normal rate and regular rhythm.  Pulmonary/Chest: Effort normal and breath sounds normal.  Psychiatric: She has a normal mood and affect. Her behavior is normal.  Results for orders placed or performed during the hospital encounter of 09/15/17  Surgical pathology  Result Value Ref Range   SURGICAL PATHOLOGY      Surgical Pathology CASE: [id number] PATIENT: Ayah Heier Surgical Pathology Report     SPECIMEN SUBMITTED: A. Colon polyp, cecum; cold snare B. Colon polyp, ascending; cold snare C. Colon polyp, tranverse; cold snare  CLINICAL HISTORY: None provided  PRE-OPERATIVE DIAGNOSIS: Screening colonoscopy  POST-OPERATIVE DIAGNOSIS: Colon polyps, diverticulosis     DIAGNOSIS: A.  COLON POLYP, CECUM; COLD SNARE: - SESSILE SERRATED ADENOMA, MULTIPLE FRAGMENTS. - NEGATIVE FOR CYTOLOGIC DYSPLASIA AND MALIGNANCY.  B.  COLON POLYP, ASCENDING; COLD SNARE: - SESSILE SERRATED ADENOMA. - NEGATIVE FOR CYTOLOGIC DYSPLASIA AND MALIGNANCY.  C.  COLON POLYP, TRANSVERSE; COLD SNARE: - SESSILE SERRATED ADENOMA. -  NEGATIVE FOR CYTOLOGIC DYSPLASIA AND MALIGNANCY.   GROSS DESCRIPTION: A. Labeled: Cecum colon polyp cold snare Received: In formalin Tissue fragment(s): Multiple Size: Aggregate, 2.0 x 0.5 x 0.1 cm Description: Fecal material and tan tissue fragment, larg e tissue fragment marked green at the base and bisected Entirely submitted in one cassette.  B. Labeled: Ascending colon polyp cold snare Received: In formalin Tissue fragment(s): Multiple Size: Aggregate, 1.5 x 0.3 x 0.1 cm Description: Tan tissue fragment and fecal material, fragment marked blue at the base Entirely submitted in one cassette.  C. Labeled: Transverse colon polyp cold snare Received: In formalin Tissue fragment(s): Multiple Size: Aggregate, 1.5 x 0.5 x 0.4 cm Description: Tan tissue fragment and fecal material, large fragment marked blue Entirely submitted in one cassette.    Final Diagnosis performed by Bryan Lemma, MD.   Electronically signed 09/17/2017 2:29:08PM The electronic signature indicates that the named Attending Pathologist has evaluated the specimen  Technical component performed at [facility], [address], [place], [place] [address] Lab: [phone] Dir: Rush Farmer, MD, MMM  Professional component performed at [facility], [facility], [place], [place], [place] [address] Lab: [phone] Dir: Dellia Nims. Rubinas, MD       Assessment & Plan:   Problem List Items Addressed This Visit      Digestive   Serrated polyp of colon    Reviewed path report; explained that GI wants to repeat her colonoscopy in 3 years        Genitourinary   CKD (chronic kidney disease) stage 2, GFR 60-89 ml/min    Avoid NSAIDs; stay hydrated        Other   Obesity (BMI 30.0-34.9)    Encouraged weight loss; aim for half to one pound per week with limiting portions, increasing walking, hydration      Hyperlipidemia    Check lipids; avoid foods with saturated fats when possible;  work on weight loss      Relevant Orders   Lipid panel       Follow up plan: Return in about 6 months (around 06/22/2018) for twenty minute follow-up with fasting labs.  An after-visit summary was printed and given to the patient at [place].  Please see the patient instructions which may contain other information and recommendations beyond what is mentioned above in the assessment and plan.  No orders of the defined types were placed in this encounter.   Orders Placed This Encounter  Procedures  . Lipid panel

## 2017-12-22 NOTE — Patient Instructions (Addendum)
Our next goal for you is 163 pounds over the next 26 weeks Check out the information at familydoctor.org entitled "Nutrition for Weight Loss: What You Need to Know about Fad Diets" Try to lose between 1-2 pounds per week by taking in fewer calories and burning off more calories You can succeed by limiting portions, limiting foods dense in calories and fat, becoming more active, and drinking 8 glasses of water a day (64 ounces) Don't skip meals, especially breakfast, as skipping meals may alter your metabolism Do not use over-the-counter weight loss pills or gimmicks that claim rapid weight loss A healthy BMI (or body mass index) is between 18.5 and 24.9 You can calculate your ideal BMI at the NIH website ClubMonetize.fr  Try to limit saturated fats in your diet (bologna, hot dogs, barbeque, cheeseburgers, hamburgers, steak, bacon, sausage, cheese, etc.) and get more fresh fruits, vegetables, and whole grains Avoid foods from cows and pigs  Obesity, Adult Obesity is the condition of having too much total body fat. Being overweight or obese means that your weight is greater than what is considered healthy for your body size. Obesity is determined by a measurement called BMI. BMI is an estimate of body fat and is calculated from height and weight. For adults, a BMI of 30 or higher is considered obese. Obesity can eventually lead to other health concerns and major illnesses, including:  Stroke.  Coronary artery disease (CAD).  Type 2 diabetes.  Some types of cancer, including cancers of the colon, breast, uterus, and gallbladder.  Osteoarthritis.  High blood pressure (hypertension).  High cholesterol.  Sleep apnea.  Gallbladder stones.  Infertility problems.  What are the causes? The main cause of obesity is taking in (consuming) more calories than your body uses for energy. Other factors that contribute to this condition may  include:  Being born with genes that make you more likely to become obese.  Having a medical condition that causes obesity. These conditions include: ? Hypothyroidism. ? Polycystic ovarian syndrome (PCOS). ? Binge-eating disorder. ? Cushing syndrome.  Taking certain medicines, such as steroids, antidepressants, and seizure medicines.  Not being physically active (sedentary lifestyle).  Living where there are limited places to exercise safely or buy healthy foods.  Not getting enough sleep.  What increases the risk? The following factors may increase your risk of this condition:  Having a family history of obesity.  Being a woman of African-American descent.  Being a man of Hispanic descent.  What are the signs or symptoms? Having excessive body fat is the main symptom of this condition. How is this diagnosed? This condition may be diagnosed based on:  Your symptoms.  Your medical history.  A physical exam. Your health care provider may measure: ? Your BMI. If you are an adult with a BMI between 25 and less than 30, you are considered overweight. If you are an adult with a BMI of 30 or higher, you are considered obese. ? The distances around your hips and your waist (circumferences). These may be compared to each other to help diagnose your condition. ? Your skinfold thickness. Your health care provider may gently pinch a fold of your skin and measure it.  How is this treated? Treatment for this condition often includes changing your lifestyle. Treatment may include some or all of the following:  Dietary changes. Work with your health care provider and a dietitian to set a weight-loss goal that is healthy and reasonable for you. Dietary changes may include eating: ?  Smaller portions. A portion size is the amount of a particular food that is healthy for you to eat at one time. This varies from person to person. ? Low-calorie or low-fat options. ? More whole grains,  fruits, and vegetables.  Regular physical activity. This may include aerobic activity (cardio) and strength training.  Medicine to help you lose weight. Your health care provider may prescribe medicine if you are unable to lose 1 pound a week after 6 weeks of eating more healthily and doing more physical activity.  Surgery. Surgical options may include gastric banding and gastric bypass. Surgery may be done if: ? Other treatments have not helped to improve your condition. ? You have a BMI of 40 or higher. ? You have life-threatening health problems related to obesity.  Follow these instructions at home:  Eating and drinking   Follow recommendations from your health care provider about what you eat and drink. Your health care provider may advise you to: ? Limit fast foods, sweets, and processed snack foods. ? Choose low-fat options, such as low-fat milk instead of whole milk. ? Eat 5 or more servings of fruits or vegetables every day. ? Eat at home more often. This gives you more control over what you eat. ? Choose healthy foods when you eat out. ? Learn what a healthy portion size is. ? Keep low-fat snacks on hand. ? Avoid sugary drinks, such as soda, fruit juice, iced tea sweetened with sugar, and flavored milk. ? Eat a healthy breakfast.  Drink enough water to keep your urine clear or pale yellow.  Do not go without eating for long periods of time (do not fast) or follow a fad diet. Fasting and fad diets can be unhealthy and even dangerous. Physical Activity  Exercise regularly, as told by your health care provider. Ask your health care provider what types of exercise are safe for you and how often you should exercise.  Warm up and stretch before being active.  Cool down and stretch after being active.  Rest between periods of activity. Lifestyle  Limit the time that you spend in front of your TV, computer, or video game system.  Find ways to reward yourself that do not  involve food.  Limit alcohol intake to no more than 1 drink a day for nonpregnant women and 2 drinks a day for men. One drink equals 12 oz of beer, 5 oz of wine, or 1 oz of hard liquor. General instructions  Keep a weight loss journal to keep track of the food you eat and how much you exercise you get.  Take over-the-counter and prescription medicines only as told by your health care provider.  Take vitamins and supplements only as told by your health care provider.  Consider joining a support group. Your health care provider may be able to recommend a support group.  Keep all follow-up visits as told by your health care provider. This is important. Contact a health care provider if:  You are unable to meet your weight loss goal after 6 weeks of dietary and lifestyle changes. This information is not intended to replace advice given to you by your health care provider. Make sure you discuss any questions you have with your health care provider. Document Released: 04/29/2004 Document Revised: 08/25/2015 Document Reviewed: 01/08/2015 Elsevier Interactive Patient Education  2018 [place].  Preventing Unhealthy [employer], Adult Staying at a healthy weight is important. When fat builds up in your body, you may become overweight or  obese. These conditions put you at greater risk for developing certain health problems, such as heart disease, diabetes, sleeping problems, joint problems, and some cancers. Unhealthy weight gain is often the result of making unhealthy choices in what you eat. It is also a result of not getting enough exercise. You can make changes to your lifestyle to prevent obesity and stay as healthy as possible. What nutrition changes can be made? To maintain a healthy weight and prevent obesity:  Eat only as much as your body needs. To do this: ? Pay attention to signs that you are hungry or full. Stop eating as soon as you feel full. ? If you feel hungry, try drinking  water first. Drink enough water so your urine is clear or pale yellow. ? Eat smaller portions. ? Look at serving sizes on food labels. Most foods contain more than one serving per container. ? Eat the recommended amount of calories for your gender and activity level. While most active people should eat around 2,000 calories per day, if you are trying to lose weight or are not very active, you main need to eat less calories. Talk to your health care provider or dietitian about how many calories you should eat each day.  Choose healthy foods, such as: ? Fruits and vegetables. Try to fill at least half of your plate at each meal with fruits and vegetables. ? Whole grains, such as whole wheat bread, brown rice, and quinoa. ? Lean meats, such as chicken or fish. ? Other healthy proteins, such as beans, eggs, or tofu. ? Healthy fats, such as nuts, seeds, fatty fish, and olive oil. ? Low-fat or fat-free dairy.  Check food labels and avoid food and drinks that: ? Are high in calories. ? Have added sugar. ? Are high in sodium. ? Have saturated fats or trans fats.  Limit how much you eat of the following foods: ? Prepackaged meals. ? Fast food. ? Fried foods. ? Processed meat, such as bacon, sausage, and deli meats. ? Fatty cuts of red meat and poultry with skin.  Cook foods in healthier ways, such as by baking, broiling, or grilling.  When grocery shopping, try to shop around the outside of the store. This helps you buy mostly fresh foods and avoid canned and prepackaged foods.  What lifestyle changes can be made?  Exercise at least 30 minutes 5 or more days each week. Exercising includes brisk walking, yard work, biking, running, swimming, and team sports like basketball and soccer. Ask your health care provider which exercises are safe for you.  Do not use any products that contain nicotine or tobacco, such as cigarettes and e-cigarettes. If you need help quitting, ask your health care  provider.  Limit alcohol intake to no more than 1 drink a day for nonpregnant women and 2 drinks a day for men. One drink equals 12 oz of beer, 5 oz of wine, or 1 oz of hard liquor.  Try to get 7-9 hours of sleep each night. What other changes can be made?  Keep a food and activity journal to keep track of: ? What you ate and how many calories you had. Remember to count sauces, dressings, and side dishes. ? Whether you were active, and what exercises you did. ? Your calorie, weight, and activity goals.  Check your weight regularly. Track any changes. If you notice you have gained weight, make changes to your diet or activity routine.  Avoid taking weight-loss medicines or supplements. Talk  to your health care provider before starting any new medicine or supplement.  Talk to your health care provider before trying any new diet or exercise plan. Why are these changes important? Eating healthy, staying active, and having healthy habits not only help prevent obesity, they also:  Help you to manage stress and emotions.  Help you to connect with friends and family.  Improve your self-esteem.  Improve your sleep.  Prevent long-term health problems.  What can happen if changes are not made? Being obese or overweight can cause you to develop joint or bone problems, which can make it hard for you to stay active or do activities you enjoy. Being obese or overweight also puts stress on your heart and lungs and can lead to health problems like diabetes, heart disease, and some cancers. Where to find more information: Talk with your health care provider or a dietitian about healthy eating and healthy lifestyle choices. You may also find other information through these resources:  U.S. Department of Agriculture MyPlate: FormerBoss.no  American Heart Association: www.heart.org  Centers for Disease Control and Prevention: http://www.wolf.info/  Summary  Staying at a healthy weight is  important. It helps prevent certain diseases and health problems, such as heart disease, diabetes, joint problems, sleep disorders, and some cancers.  Being obese or overweight can cause you to develop joint or bone problems, which can make it hard for you to stay active or do activities you enjoy.  You can prevent unhealthy weight gain by eating a healthy diet, exercising regularly, not smoking, limiting alcohol, and getting enough sleep.  Talk with your health care provider or a dietitian for guidance about healthy eating and healthy lifestyle choices. This information is not intended to replace advice given to you by your health care provider. Make sure you discuss any questions you have with your health care provider. Document Released: 03/23/2016 Document Revised: 04/28/2016 Document Reviewed: 04/28/2016 Elsevier Interactive Patient Education  [employer].

## 2017-12-22 NOTE — Assessment & Plan Note (Signed)
Encouraged weight loss; aim for half to one pound per week with limiting portions, increasing walking, hydration

## 2017-12-22 NOTE — Assessment & Plan Note (Signed)
Reviewed path report; explained that GI wants to repeat her colonoscopy in 3 years

## 2017-12-22 NOTE — Assessment & Plan Note (Signed)
Check lipids; avoid foods with saturated fats when possible; work on weight loss

## 2017-12-24 ENCOUNTER — Emergency Department: Payer: Medicare HMO

## 2017-12-24 ENCOUNTER — Encounter: Payer: Self-pay | Admitting: Internal Medicine

## 2017-12-24 ENCOUNTER — Emergency Department
Admission: EM | Admit: 2017-12-24 | Discharge: 2017-12-24 | Disposition: A | Discharge status: Home / Self Care | Payer: Medicare HMO | Attending: Emergency Medicine | Admitting: Emergency Medicine

## 2017-12-24 ENCOUNTER — Other Ambulatory Visit: Payer: Self-pay

## 2017-12-24 DIAGNOSIS — N182 Chronic kidney disease, stage 2 (mild): Secondary | ICD-10-CM | POA: Insufficient documentation

## 2017-12-24 DIAGNOSIS — Y9389 Activity, other specified: Secondary | ICD-10-CM | POA: Diagnosis not present

## 2017-12-24 DIAGNOSIS — Y999 Unspecified external cause status: Secondary | ICD-10-CM | POA: Insufficient documentation

## 2017-12-24 DIAGNOSIS — S82424A Nondisplaced transverse fracture of shaft of right fibula, initial encounter for closed fracture: Secondary | ICD-10-CM | POA: Diagnosis not present

## 2017-12-24 DIAGNOSIS — Z79899 Other long term (current) drug therapy: Secondary | ICD-10-CM | POA: Insufficient documentation

## 2017-12-24 DIAGNOSIS — W1843XA Slipping, tripping and stumbling without falling due to stepping from one level to another, initial encounter: Secondary | ICD-10-CM | POA: Diagnosis not present

## 2017-12-24 DIAGNOSIS — S92154A Nondisplaced avulsion fracture (chip fracture) of right talus, initial encounter for closed fracture: Secondary | ICD-10-CM | POA: Diagnosis not present

## 2017-12-24 DIAGNOSIS — I129 Hypertensive chronic kidney disease with stage 1 through stage 4 chronic kidney disease, or unspecified chronic kidney disease: Secondary | ICD-10-CM | POA: Insufficient documentation

## 2017-12-24 DIAGNOSIS — S82831A Other fracture of upper and lower end of right fibula, initial encounter for closed fracture: Secondary | ICD-10-CM | POA: Diagnosis not present

## 2017-12-24 DIAGNOSIS — M25571 Pain in right ankle and joints of right foot: Secondary | ICD-10-CM | POA: Diagnosis not present

## 2017-12-24 DIAGNOSIS — S92034A Nondisplaced avulsion fracture of tuberosity of right calcaneus, initial encounter for closed fracture: Secondary | ICD-10-CM | POA: Insufficient documentation

## 2017-12-24 DIAGNOSIS — S82491A Other fracture of shaft of right fibula, initial encounter for closed fracture: Secondary | ICD-10-CM | POA: Diagnosis not present

## 2017-12-24 DIAGNOSIS — M79671 Pain in right foot: Secondary | ICD-10-CM

## 2017-12-24 DIAGNOSIS — Y929 Unspecified place or not applicable: Secondary | ICD-10-CM | POA: Diagnosis not present

## 2017-12-24 DIAGNOSIS — S92151A Displaced avulsion fracture (chip fracture) of right talus, initial encounter for closed fracture: Secondary | ICD-10-CM | POA: Diagnosis not present

## 2017-12-24 NOTE — ED Provider Notes (Signed)
[facility] Emergency Department Provider Note ____________________________________________  Time seen: 2120  I have reviewed the triage vital signs and the nursing notes.  HISTORY  Chief Complaint  Ankle Pain   HPI Laura Oneill is a 67 y.o. female resents to the ER today with complaint of right ankle pain and swelling.  She reports about 3 hours ago she missed the last step while coming down the stairs.  She was able to weight-bear immediately after the injury.  She denies any numbness or tingling in the right lower extremity.  She did not take any medication or apply any ice prior to arrival.  Past Medical History:  Diagnosis Date  . Hyperlipidemia   . Osteopenia 11/04/2017   July 2019    Patient Active Problem List   Diagnosis Date Noted  . Obesity (BMI 30.0-34.9) 12/22/2017  . Serrated polyp of colon 12/22/2017  . Osteopenia 11/04/2017  . Medicare annual wellness visit, initial 09/05/2017  . CKD (chronic kidney disease) stage 2, GFR 60-89 ml/min 09/05/2017  . Hyperlipidemia 08/25/2016  . Well woman exam without gynecological exam 06/24/2015  . Screening for breast cancer 06/24/2015  . Post-menopausal 06/24/2015    Past Surgical History:  Procedure Laterality Date  . COLONOSCOPY WITH PROPOFOL N/A 09/15/2017   Procedure: COLONOSCOPY WITH PROPOFOL;  Surgeon: Jonathon Bellows, MD;  Location: [facility] ENDOSCOPY;  Service: Gastroenterology;  Laterality: N/A;    Prior to Admission medications   Medication Sig Start Date End Date Taking? Authorizing Provider  Calcium Carb-Cholecalciferol (CALCIUM/VITAMIN D) 500-200 MG-UNIT TABS Take 1 capsule by mouth daily.    [provider]  Multiple Vitamin (MULTIVITAMIN) tablet Take 1 tablet by mouth daily.    [provider]  Multiple Vitamins-Minerals (PRESERVISION AREDS 2 PO) Take by mouth.    [provider]  Omega-3 Fatty Acids (FISH OIL) 1000 MG CAPS Take 2 capsules by mouth daily.      [provider]    Allergies Latex  Family History  Problem Relation Age of Onset  . Hypothyroidism Mother   . COPD Father   . Breast cancer Neg Hx     Social History Social History   Tobacco Use  . Smoking status: Never Smoker  . Smokeless tobacco: Never Used  Substance Use Topics  . Alcohol use: Yes    Alcohol/week: 0.0 standard drinks    Comment: occasional  . Drug use: No    Review of Systems  Constitutional: Negative for fever. Musculoskeletal: Positive for right ankle pain, swelling and difficulty with gait.   Skin: Positive for bruising of right ankle. Neurological: Negative for focal weakness, tingling or numbness. ____________________________________________  PHYSICAL EXAM:  VITAL SIGNS: ED Triage Vitals  Enc Vitals Group     BP 12/24/17 2105 137/77     Pulse Rate 12/24/17 2105 62     Resp 12/24/17 2105 16     Temp 12/24/17 2105 98.5 F (36.9 C)     Temp Source 12/24/17 2105 Oral     SpO2 12/24/17 2105 97 %     Weight 12/24/17 2105 181 lb (82.1 kg)     Height 12/24/17 2105 5\' 2"  (1.575 m)     Head Circumference --      Peak Flow --      Pain Score 12/24/17 2108 2     Pain Loc --      Pain Edu? --      Excl. in [place]? --      Constitutional: Alert and  oriented. Well appearing and in no distress. Cardiovascular: Pedal pulse 2+ bilaterally. Musculoskeletal: Normal flexion, extension and rotation of the right ankle. Swelling noted over the lateral malleolus. Pain with palpation over the 4th and 5th metatarsals. Strength 5/5 BLE. Neurologic:  Gait not visualized, I want xray first. Sensation intact to BLE. Skin:  Bruising noted over lateral malleolus, 3rd, 4th and 5th metatarsals. _____________________________________________   RADIOLOGY  Imaging Orders     DG Ankle Complete Right     DG Foot Complete Right   IMPRESSION: 1. Avulsion fractures off the lateral aspect of the mid and hindfoot better visualized on the same day foot  radiographs and consistent with avulsions off the anterolateral calcaneus and cuboid based on that study. 2. Nondisplaced acute transverse fracture through the tip of the fibula. 3. Avulsed fracture off the dorsum of the anterior talus. 4. Moderate soft tissue swelling about the ankle, more so laterally.  IMPRESSION: 1. Cortical avulsion fracture from the dorsal aspect of the anterior talus. 2. Distal fibula fracture below the level of the ankle mortise. ____________________________________________  PROCEDURES  .Splint Application Date/Time: 07/02/5186 10:15 PM Performed by: Domenic Moras, NT Authorized by: Jearld Fenton, NP   Consent:    Consent obtained:  Verbal   Consent given by:  Patient and spouse   Risks discussed:  Pain and swelling Pre-procedure details:    Sensation:  Normal Procedure details:    Laterality:  Right   Location:  Ankle   Ankle:  R ankle   Strapping: no     Splint type:  Sugar tong and short leg   Supplies:  Ortho-Glass Post-procedure details:    Pain:  Improved   Sensation:  Normal   Patient tolerance of procedure:  Tolerated well, no immediate complications    ____________________________________________  INITIAL IMPRESSION / ASSESSMENT AND PLAN / ED COURSE  Right Ankle/Foot Pain s/p Fall:  Xray consistent with multiple small nondisplaced fractures Pt placed in sugar tong/posterior short leg splint Crutches given Advised ice, Ibuprofen and Tyelnol OTC Follow up with ortho on Monday ____________________________________________  FINAL CLINICAL IMPRESSION(S) / ED DIAGNOSES  Final diagnoses:  Acute right ankle pain  Right foot pain  Closed nondisplaced avulsion fracture of right talus, initial encounter  Closed fracture of distal end of right fibula, unspecified fracture morphology, initial encounter  Closed nondisplaced avulsion fracture of tuberosity of right calcaneus, initial encounter      Jearld Fenton, NP 12/24/17  2224    Carrie Mew, MD 12/25/17 1920

## 2017-12-24 NOTE — ED Triage Notes (Signed)
Patient reports missed last step and now with right ankle pain.

## 2017-12-24 NOTE — Discharge Instructions (Addendum)
You have been diagnosed with a calcaneal, fibular and talar fracture of the right ankle. You have been placed in a splint and given crutches. Please do not bear full weight on your ankle until you are evaluated by ortho. You can take Ibuprofen 600 mg every 8 hours or Tylenol 650 mg every 8 hours as needed for pain and inflammation. Ice will help reduce swelling

## 2017-12-26 DIAGNOSIS — S82821A Torus fracture of lower end of right fibula, initial encounter for closed fracture: Secondary | ICD-10-CM | POA: Diagnosis not present

## 2017-12-26 DIAGNOSIS — S92114A Nondisplaced fracture of neck of right talus, initial encounter for closed fracture: Secondary | ICD-10-CM | POA: Diagnosis not present

## 2018-01-11 DIAGNOSIS — S82831A Other fracture of upper and lower end of right fibula, initial encounter for closed fracture: Secondary | ICD-10-CM | POA: Diagnosis not present

## 2018-01-11 DIAGNOSIS — S92114A Nondisplaced fracture of neck of right talus, initial encounter for closed fracture: Secondary | ICD-10-CM | POA: Diagnosis not present

## 2018-01-18 ENCOUNTER — Encounter: Payer: Self-pay | Admitting: Family Medicine

## 2018-01-18 ENCOUNTER — Ambulatory Visit (INDEPENDENT_AMBULATORY_CARE_PROVIDER_SITE_OTHER): Payer: Medicare HMO

## 2018-01-18 DIAGNOSIS — S82821A Torus fracture of lower end of right fibula, initial encounter for closed fracture: Secondary | ICD-10-CM | POA: Insufficient documentation

## 2018-01-18 DIAGNOSIS — Z23 Encounter for immunization: Secondary | ICD-10-CM

## 2018-02-01 DIAGNOSIS — S92114A Nondisplaced fracture of neck of right talus, initial encounter for closed fracture: Secondary | ICD-10-CM | POA: Diagnosis not present

## 2018-02-01 DIAGNOSIS — S82831A Other fracture of upper and lower end of right fibula, initial encounter for closed fracture: Secondary | ICD-10-CM | POA: Diagnosis not present

## 2018-02-22 DIAGNOSIS — S82831A Other fracture of upper and lower end of right fibula, initial encounter for closed fracture: Secondary | ICD-10-CM | POA: Diagnosis not present

## 2018-02-22 DIAGNOSIS — S92114A Nondisplaced fracture of neck of right talus, initial encounter for closed fracture: Secondary | ICD-10-CM | POA: Diagnosis not present

## 2018-03-22 DIAGNOSIS — S82831A Other fracture of upper and lower end of right fibula, initial encounter for closed fracture: Secondary | ICD-10-CM | POA: Diagnosis not present

## 2018-03-22 DIAGNOSIS — S92114A Nondisplaced fracture of neck of right talus, initial encounter for closed fracture: Secondary | ICD-10-CM | POA: Diagnosis not present

## 2018-04-12 DIAGNOSIS — R69 Illness, unspecified: Secondary | ICD-10-CM | POA: Diagnosis not present

## 2018-06-12 ENCOUNTER — Telehealth: Payer: Self-pay

## 2018-06-13 MED ORDER — DIMENHYDRINATE 50 MG PO TABS: 50.0000 mg | ORAL_TABLET | Freq: Three times a day (TID) | ORAL | 0 refills | Status: DC | PRN

## 2018-06-13 NOTE — Telephone Encounter (Signed)
Patient going on cruise; asked for Rx

## 2018-09-13 ENCOUNTER — Telehealth: Payer: Self-pay

## 2018-09-13 NOTE — Telephone Encounter (Signed)
Contacted patient to confirm wellness appts. She and her daughter are coming in tomorrow and her daughter is having bloodwork done. Patient wants to know if Laura Oneill can order labs for her as well due to abnormal lipid panel in Sept and due for yearly labwork. She is due for yearly exam and plans to schedule with Laura Oneill. Advised patient I would notify her tomorrow at appt if lab orders available. Thank you!

## 2018-09-14 ENCOUNTER — Other Ambulatory Visit: Payer: Self-pay

## 2018-09-14 ENCOUNTER — Ambulatory Visit (INDEPENDENT_AMBULATORY_CARE_PROVIDER_SITE_OTHER): Payer: Medicare HMO

## 2018-09-14 VITALS — BP 120/78 | HR 63 | Temp 97.5°F | Resp 16 | Ht 62.0 in | Wt 171.7 lb

## 2018-09-14 DIAGNOSIS — Z23 Encounter for immunization: Secondary | ICD-10-CM

## 2018-09-14 DIAGNOSIS — Z0000 Encounter for general adult medical examination without abnormal findings: Secondary | ICD-10-CM | POA: Diagnosis not present

## 2018-09-14 DIAGNOSIS — Z1231 Encounter for screening mammogram for malignant neoplasm of breast: Secondary | ICD-10-CM

## 2018-09-14 DIAGNOSIS — E782 Mixed hyperlipidemia: Secondary | ICD-10-CM | POA: Diagnosis not present

## 2018-09-14 DIAGNOSIS — N182 Chronic kidney disease, stage 2 (mild): Secondary | ICD-10-CM

## 2018-09-14 LAB — COMPLETE METABOLIC PANEL WITH GFR
AG Ratio: 2.3 (calc) (ref 1.0–2.5)
ALT: 15 U/L (ref 6–29)
AST: 17 U/L (ref 10–35)
Albumin: 4.1 g/dL (ref 3.6–5.1)
Alkaline phosphatase (APISO): 53 U/L (ref 37–153)
BUN: 16 mg/dL (ref 7–25)
CO2: 24 mmol/L (ref 20–32)
Calcium: 9.2 mg/dL (ref 8.6–10.4)
Chloride: 107 mmol/L (ref 98–110)
Creat: 0.72 mg/dL (ref 0.50–0.99)
GFR, Est African American: 101 mL/min/{1.73_m2} (ref >=60)
GFR, Est Non African American: 87 mL/min/{1.73_m2} (ref >=60)
Globulin: 1.8 g/dL (calc) — ABNORMAL LOW (ref 1.9–3.7)
Glucose, Bld: 86 mg/dL (ref 65–99)
Potassium: 4.2 mmol/L (ref 3.5–5.3)
Sodium: 140 mmol/L (ref 135–146)
Total Bilirubin: 0.5 mg/dL (ref 0.2–1.2)
Total Protein: 5.9 g/dL — ABNORMAL LOW (ref 6.1–8.1)

## 2018-09-14 LAB — LIPID PANEL
Cholesterol: 181 mg/dL (ref <200)
HDL: 52 mg/dL (ref >=50)
LDL Cholesterol (Calc): 110 mg/dL (calc) — ABNORMAL HIGH
Non-HDL Cholesterol (Calc): 129 mg/dL (calc) (ref <130)
Total CHOL/HDL Ratio: 3.5 (calc) (ref <5.0)
Triglycerides: 94 mg/dL (ref <150)

## 2018-09-14 NOTE — Patient Instructions (Signed)
Laura Oneill , Thank you for taking time to come for your Medicare Wellness Visit. I appreciate your ongoing commitment to your health goals. Please review the following plan we discussed and let me know if I can assist you in the future.   Screening recommendations/referrals: Colonoscopy: done 09/15/17. Repeat in 2022. Mammogram: done 11/01/17. Please call [phone] to schedule your mammogram.  Bone Density: done 11/01/17 Recommended yearly ophthalmology/optometry visit for glaucoma screening and checkup Recommended yearly dental visit for hygiene and checkup  Vaccinations: Influenza vaccine: done 01/18/18 Pneumococcal vaccine: done today Tdap vaccine: due - please contact us if you get a cut or scrape Shingles vaccine: Shingrix discussed. Please contact your pharmacy for coverage information.   Advanced directives: Please bring a copy of your health care power of attorney and living will to the office at your convenience once you have completed those documents.   Conditions/risks identified: continue healthy eating and increase physical activity for desired weight loss  Next appointment: Please follow up in one year for your Medicare Annual Wellness visit.     Preventive Care 8 Years and Older, Female Preventive care refers to lifestyle choices and visits with your health care provider that can promote health and wellness. What does preventive care include?  A yearly physical exam. This is also called an annual well check.  Dental exams once or twice a year.  Routine eye exams. Ask your health care provider how often you should have your eyes checked.  Personal lifestyle choices, including:  Daily care of your teeth and gums.  Regular physical activity.  Eating a healthy diet.  Avoiding tobacco and drug use.  Limiting alcohol use.  Practicing safe sex.  Taking low-dose aspirin every day.  Taking vitamin and mineral supplements as recommended by your health care  provider. What happens during an annual well check? The services and screenings done by your health care provider during your annual well check will depend on your age, overall health, lifestyle risk factors, and family history of disease. Counseling  Your health care provider may ask you questions about your:  Alcohol use.  Tobacco use.  Drug use.  Emotional well-being.  Home and relationship well-being.  Sexual activity.  Eating habits.  History of falls.  Memory and ability to understand (cognition).  Work and work Statistician.  Reproductive health. Screening  You may have the following tests or measurements:  Height, weight, and BMI.  Blood pressure.  Lipid and cholesterol levels. These may be checked every 5 years, or more frequently if you are over 52 years old.  Skin check.  Lung cancer screening. You may have this screening every year starting at age 90 if you have a 30-pack-year history of smoking and currently smoke or have quit within the past 15 years.  Fecal occult blood test (FOBT) of the stool. You may have this test every year starting at age 48.  Flexible sigmoidoscopy or colonoscopy. You may have a sigmoidoscopy every 5 years or a colonoscopy every 10 years starting at age 27.  Hepatitis C blood test.  Hepatitis B blood test.  Sexually transmitted disease (STD) testing.  Diabetes screening. This is done by checking your blood sugar (glucose) after you have not eaten for a while (fasting). You may have this done every 1-3 years.  Bone density scan. This is done to screen for osteoporosis. You may have this done starting at age 54.  Mammogram. This may be done every 1-2 years. Talk to your health care provider  about how often you should have regular mammograms. Talk with your health care provider about your test results, treatment options, and if necessary, the need for more tests. Vaccines  Your health care provider may recommend certain  vaccines, such as:  Influenza vaccine. This is recommended every year.  Tetanus, diphtheria, and acellular pertussis (Tdap, Td) vaccine. You may need a Td booster every 10 years.  Zoster vaccine. You may need this after age 46.  Pneumococcal 13-valent conjugate (PCV13) vaccine. One dose is recommended after age 37.  Pneumococcal polysaccharide (PPSV23) vaccine. One dose is recommended after age 69. Talk to your health care provider about which screenings and vaccines you need and how often you need them. This information is not intended to replace advice given to you by your health care provider. Make sure you discuss any questions you have with your health care provider. Document Released: 04/18/2015 Document Revised: 12/10/2015 Document Reviewed: 01/21/2015 Elsevier Interactive Patient Education  2017 [employer] Prevention in the Home Falls can cause injuries. They can happen to people of all ages. There are many things you can do to make your home safe and to help prevent falls. What can I do on the outside of my home?  Regularly fix the edges of walkways and driveways and fix any cracks.  Remove anything that might make you trip as you walk through a door, such as a raised step or threshold.  Trim any bushes or trees on the path to your home.  Use bright outdoor lighting.  Clear any walking paths of anything that might make someone trip, such as rocks or tools.  Regularly check to see if handrails are loose or broken. Make sure that both sides of any steps have handrails.  Any raised decks and porches should have guardrails on the edges.  Have any leaves, snow, or ice cleared regularly.  Use sand or salt on walking paths during winter.  Clean up any spills in your garage right away. This includes oil or grease spills. What can I do in the bathroom?  Use night lights.  Install grab bars by the toilet and in the tub and shower. Do not use towel bars as grab  bars.  Use non-skid mats or decals in the tub or shower.  If you need to sit down in the shower, use a plastic, non-slip stool.  Keep the floor dry. Clean up any water that spills on the floor as soon as it happens.  Remove soap buildup in the tub or shower regularly.  Attach bath mats securely with double-sided non-slip rug tape.  Do not have throw rugs and other things on the floor that can make you trip. What can I do in the bedroom?  Use night lights.  Make sure that you have a light by your bed that is easy to reach.  Do not use any sheets or blankets that are too big for your bed. They should not hang down onto the floor.  Have a firm chair that has side arms. You can use this for support while you get dressed.  Do not have throw rugs and other things on the floor that can make you trip. What can I do in the kitchen?  Clean up any spills right away.  Avoid walking on wet floors.  Keep items that you use a lot in easy-to-reach places.  If you need to reach something above you, use a strong step stool that has a grab bar.  Keep electrical cords out of the way.  Do not use floor polish or wax that makes floors slippery. If you must use wax, use non-skid floor wax.  Do not have throw rugs and other things on the floor that can make you trip. What can I do with my stairs?  Do not leave any items on the stairs.  Make sure that there are handrails on both sides of the stairs and use them. Fix handrails that are broken or loose. Make sure that handrails are as long as the stairways.  Check any carpeting to make sure that it is firmly attached to the stairs. Fix any carpet that is loose or worn.  Avoid having throw rugs at the top or bottom of the stairs. If you do have throw rugs, attach them to the floor with carpet tape.  Make sure that you have a light switch at the top of the stairs and the bottom of the stairs. If you do not have them, ask someone to add them for  you. What else can I do to help prevent falls?  Wear shoes that:  Do not have high heels.  Have rubber bottoms.  Are comfortable and fit you well.  Are closed at the toe. Do not wear sandals.  If you use a stepladder:  Make sure that it is fully opened. Do not climb a closed stepladder.  Make sure that both sides of the stepladder are locked into place.  Ask someone to hold it for you, if possible.  Clearly mark and make sure that you can see:  Any grab bars or handrails.  First and last steps.  Where the edge of each step is.  Use tools that help you move around (mobility aids) if they are needed. These include:  Canes.  Walkers.  Scooters.  Crutches.  Turn on the lights when you go into a dark area. Replace any light bulbs as soon as they burn out.  Set up your furniture so you have a clear path. Avoid moving your furniture around.  If any of your floors are uneven, fix them.  If there are any pets around you, be aware of where they are.  Review your medicines with your doctor. Some medicines can make you feel dizzy. This can increase your chance of falling. Ask your doctor what other things that you can do to help prevent falls. This information is not intended to replace advice given to you by your health care provider. Make sure you discuss any questions you have with your health care provider. Document Released: 01/16/2009 Document Revised: 08/28/2015 Document Reviewed: 04/26/2014 Elsevier Interactive Patient Education  2017 [employer].

## 2018-09-14 NOTE — Progress Notes (Signed)
Subjective:   Laura Oneill is a 68 y.o. female who presents for an Initial Medicare Annual Wellness Visit.  Review of Systems      Cardiac Risk Factors include: advanced age (>28men, >33 women);obesity (BMI >30kg/m2)     Objective:    Today's Vitals   09/14/18 0927 09/14/18 0928  BP: 120/78   Pulse: 63   Resp: 16   Temp: (!) 97.5 F (36.4 C)   TempSrc: Oral   SpO2: 96%   Weight: 171 lb 11.2 oz (77.9 kg)   Height: 5\' 2"  (1.575 m)   PainSc:  2    Body mass index is 31.4 kg/m.  Advanced Directives 09/14/2018 12/24/2017 09/15/2017 08/25/2016 08/25/2016 06/24/2015  Does Patient Have a Medical Advance Directive? No No No No No Yes  Type of Advance Directive - - - - - Living will  Does patient want to make changes to medical advance directive? No - Patient declined - - - - No - Patient declined  Copy of [place] in Chart? - - - - - No - copy requested  Would patient like information on creating a medical advance directive? - - No - Patient declined - - -    Current Medications (verified) Outpatient Encounter Medications as of 09/14/2018  Medication Sig  . Calcium Carb-Cholecalciferol (CALCIUM/VITAMIN D) 500-200 MG-UNIT TABS Take 1 capsule by mouth daily.  . Misc Natural Products (ADVANCED JOINT RELIEF) CAPS Take by mouth.  . Multiple Vitamin (MULTIVITAMIN) tablet Take 1 tablet by mouth daily.  . Multiple Vitamins-Minerals (PRESERVISION AREDS 2 PO) Take by mouth.  . Omega-3 Fatty Acids (FISH OIL) 1000 MG CAPS Take 2 capsules by mouth daily.   . [DISCONTINUED] dimenhyDRINATE (DRAMAMINE) 50 MG tablet Take 1 tablet (50 mg total) by mouth every 8 (eight) hours as needed.   No facility-administered encounter medications on file as of 09/14/2018.     Allergies (verified) Latex   History: Past Medical History:  Diagnosis Date  . Hyperlipidemia   . Macular degeneration, age related   . Osteopenia 11/04/2017   July 2019   Past Surgical History:  Procedure  Laterality Date  . COLONOSCOPY WITH PROPOFOL N/A 09/15/2017   Procedure: COLONOSCOPY WITH PROPOFOL;  Surgeon: Jonathon Bellows, MD;  Location[facility] ENDOSCOPY;  Service: Gastroenterology;  Laterality: N/A;   Family History  Problem Relation Age of Onset  . Hypothyroidism Mother   . COPD Father   . Breast cancer Neg Hx    Social History   Socioeconomic History  . Marital status: Married    Spouse name: Not on file  . Number of children: 2  . Years of education: Not on file  . Highest education level: High school graduate  Occupational History  . Occupation: retired  Scientific laboratory technician  . Financial resource strain: Not hard at all  . Food insecurity    Worry: Never true    Inability: Never true  . Transportation needs    Medical: No    Non-medical: No  Tobacco Use  . Smoking status: Never Smoker  . Smokeless tobacco: Never Used  Substance and Sexual Activity  . Alcohol use: Yes    Alcohol/week: 0.0 standard drinks    Comment: occasional  . Drug use: No  . Sexual activity: Yes  Lifestyle  . Physical activity    Days per week: 0 days    Minutes per session: 0 min  . Stress: Only a little  Relationships  . Social connections  Talks on phone: More than three times a week    Gets together: Three times a week    Attends religious service: More than 4 times per year    Active member of club or organization: No    Attends meetings of clubs or organizations: Never    Relationship status: Married  Other Topics Concern  . Not on file  Social History Narrative  . Not on file    Tobacco Counseling Counseling given: Not Answered   Clinical Intake:  Pre-visit preparation completed: Yes  Pain : 0-10 Pain Score: 2 (worse at night) Pain Type: Acute pain Pain Location: Leg Pain Orientation: Left, Lower Pain Descriptors / Indicators: Aching, Stabbing Pain Onset: More than a month ago Pain Frequency: Intermittent     BMI - recorded: 31.4 Nutritional Status: BMI > 30  Obese  Nutritional Risks: None Diabetes: No  How often do you need to have someone help you when you read instructions, pamphlets, or other written materials from your doctor or pharmacy?: 1 - Never  Interpreter Needed?: No  Information entered by :: Clemetine Marker LPN   Activities of Daily Living In your present state of health, do you have any difficulty performing the following activities: 09/14/2018 12/22/2017  Hearing? N N  Comment declines hearing aids -  Vision? N N  Comment wears glasses -  Difficulty concentrating or making decisions? N N  Walking or climbing stairs? N N  Dressing or bathing? N N  Doing errands, shopping? N N  Preparing Food and eating ? N -  Using the Toilet? N -  In the past six months, have you accidently leaked urine? N -  Do you have problems with loss of bowel control? N -  Managing your Medications? N -  Managing your Finances? N -  Housekeeping or managing your Housekeeping? N -  Some recent data might be hidden     Immunizations and Health Maintenance Immunization History  Administered Date(s) Administered  . Influenza, High Dose Seasonal PF 01/18/2018  . Influenza,inj,Quad PF,6+ Mos 12/25/2014, 01/22/2016, 01/10/2017  . Pneumococcal Conjugate-13 09/05/2017  . Pneumococcal Polysaccharide-23 09/14/2018  . Zoster 12/25/2014   Health Maintenance Due  Topic Date Due  . PNA vac Low Risk Adult (2 of 2 - PPSV23) 09/06/2018    Patient Care Team: Lada, Satira Anis, MD as PCP - General (Family Medicine)  Indicate any recent Medical Services you may have received from other than [facility] providers in the past year (date may be approximate).     Assessment:   This is a routine wellness examination for [place].  Hearing/Vision screen  Hearing Screening   125Hz  250Hz  500Hz  1000Hz  2000Hz  3000Hz  4000Hz  6000Hz  8000Hz   Right ear:           Left ear:           Comments: Pt denies hearing difficulty  Vision Screening Comments: [facility] screenings [facility]  [facility] Dr. Thomasene Ripple  Dietary issues and exercise activities discussed: Current Exercise Habits: The patient does not participate in regular exercise at present, Exercise limited by: None identified  Goals    . Weight (lb) < 160 lb (72.6 kg) (pt-stated)      Depression Screen PHQ 2/9 Scores 09/14/2018 12/22/2017 12/06/2017 11/14/2017 09/05/2017 08/25/2016 06/24/2015  PHQ - 2 Score 0 0 0 0 0 0 0    Fall Risk Fall Risk  09/14/2018 12/22/2017 12/06/2017 11/14/2017 09/05/2017  Falls in the past year? 0 No No No No  Number  falls in past yr: 0 - - - -  Injury with Fall? 0 - - - -  Follow up Falls prevention discussed - - - -   FALL RISK PREVENTION PERTAINING TO THE HOME:  Any stairs in or around the home? No  If so, do they handrails? No   Home free of loose throw rugs in walkways, pet beds, electrical cords, etc? Yes  Adequate lighting in your home to reduce risk of falls? Yes   ASSISTIVE DEVICES UTILIZED TO PREVENT FALLS:  Life alert? No  Use of a cane, walker or w/c? No  Grab bars in the bathroom? Yes  Shower chair or bench in shower? Yes  Elevated toilet seat or a handicapped toilet? No   DME ORDERS:  DME order needed?  No   TIMED UP AND GO:  Was the test performed? Yes .  Length of time to ambulate 10 feet: 6 sec.   GAIT:  Appearance of gait: Gait stead-fast and without the use of an assistive device.  Education: Fall risk prevention has been discussed.  Intervention(s) required? No   Cognitive Function:     6CIT Screen 09/14/2018 09/05/2017  What Year? 0 points 0 points  What month? 0 points 0 points  What time? 0 points 0 points  Count back from 20 0 points 0 points  Months in reverse 2 points 0 points  Repeat phrase 0 points 2 points  Total Score 2 2    Screening Tests Health Maintenance  Topic Date Due  . PNA vac Low Risk Adult (2 of 2 - PPSV23) 09/06/2018  . TETANUS/TDAP  04/06/2019 (Originally 04/03/1970)  . MAMMOGRAM  11/02/2018  . INFLUENZA  VACCINE  11/04/2018  . COLONOSCOPY  09/15/2020  . DEXA SCAN  Completed  . Hepatitis C Screening  Completed    Qualifies for Shingles Vaccine? Yes  Zostavax completed 2016. Due for Shingrix. Education has been provided regarding the importance of this vaccine. Pt has been advised to call insurance company to determine out of pocket expense. Advised may also receive vaccine at local pharmacy or Health Dept. Verbalized acceptance and understanding.  Tdap: Although this vaccine is not a covered service during a Wellness Exam, does the patient still wish to receive this vaccine today?  No .  Education has been provided regarding the importance of this vaccine. Advised may receive this vaccine at local pharmacy or Health Dept. Aware to provide a copy of the vaccination record if obtained from local pharmacy or Health Dept. Verbalized acceptance and understanding.  Flu Vaccine: Up to date  Pneumococcal Vaccine: Due for Pneumococcal vaccine. Does the patient want to receive this vaccine today?  Yes . Education has been provided regarding the importance of this vaccine but still declined. Advised may receive this vaccine at local pharmacy or Health Dept. Aware to provide a copy of the vaccination record if obtained from local pharmacy or Health Dept. Verbalized acceptance and understanding.  Cancer Screenings:  Colorectal Screening: Completed 09/15/17. Repeat every 3 years  Mammogram: Completed 11/01/17. Repeat every year. Ordered today. Pt provided with contact information and advised to call to schedule appt.   Bone Density: Completed 11/01/17. Results reflect  OSTEOPENIA. Repeat every 2 years.  Lung Cancer Screening: (Low Dose CT Chest recommended if Age 89-80 years, 30 pack-year currently smoking OR have quit w/in 15years.) does not qualify.    Additional Screening:  Hepatitis C Screening: does qualify; Completed 08/25/16  Vision Screening: Recommended annual ophthalmology exams for early  detection of glaucoma and other disorders of the eye. Is the patient up to date with their annual eye exam?  Yes  Who is the provider or what is the name of the office in which the pt attends annual eye exams? Dr. Thomasene Ripple  Dental Screening: Recommended annual dental exams for proper oral hygiene  Community Resource Referral:  CRR required this visit?  No      Plan:    I have personally reviewed and addressed the Medicare Annual Wellness questionnaire and have noted the following in the patient's chart:  A. Medical and social history B. Use of alcohol, tobacco or illicit drugs  C. Current medications and supplements D. Functional ability and status E.  Nutritional status F.  Physical activity G. Advance directives H. List of other physicians I.  Hospitalizations, surgeries, and ER visits in previous 12 months J.  [place] such as hearing and vision if needed, cognitive and depression L. Referrals and appointments   In addition, I have reviewed and discussed with patient certain preventive protocols, quality metrics, and best practice recommendations. A written personalized care plan for preventive services as well as general preventive health recommendations were provided to patient.   Signed,  Clemetine Marker, LPN Nurse Health Advisor   Nurse Notes: pt doing well and appreciative of visit today.

## 2018-09-19 ENCOUNTER — Other Ambulatory Visit: Payer: Self-pay

## 2018-09-19 ENCOUNTER — Ambulatory Visit (INDEPENDENT_AMBULATORY_CARE_PROVIDER_SITE_OTHER): Payer: Medicare HMO | Admitting: Nurse Practitioner

## 2018-09-19 ENCOUNTER — Encounter: Payer: Self-pay | Admitting: Nurse Practitioner

## 2018-09-19 VITALS — BP 116/70 | HR 67 | Temp 98.3°F | Resp 12 | Ht 62.0 in | Wt 173.1 lb

## 2018-09-19 DIAGNOSIS — M85859 Other specified disorders of bone density and structure, unspecified thigh: Secondary | ICD-10-CM | POA: Diagnosis not present

## 2018-09-19 DIAGNOSIS — E782 Mixed hyperlipidemia: Secondary | ICD-10-CM

## 2018-09-19 DIAGNOSIS — N182 Chronic kidney disease, stage 2 (mild): Secondary | ICD-10-CM | POA: Diagnosis not present

## 2018-09-19 DIAGNOSIS — M5432 Sciatica, left side: Secondary | ICD-10-CM

## 2018-09-19 MED ORDER — MELOXICAM 7.5 MG PO TABS: 7.5000 mg | ORAL_TABLET | Freq: Every day | ORAL | 0 refills | Status: DC

## 2018-09-19 NOTE — Progress Notes (Signed)
Name: Laura Oneill   MRN: [id number]    DOB: 1950/04/25   Date:09/19/2018       Progress Note  Subjective  Chief Complaint  Chief Complaint  Patient presents with  . Follow-up    HPI  Patient endores left mid buttocks pain that is severe after long sitting. Patient states ocassional has pain that is achy that goes all the way down the side. This pain is worse at night. This pain has been intermittent for about a year, but has been acutely worsening recently. Relieved by meloxicam in the past.    Hyperlipidemia  Patient is taking fish oil 1000mg  BID. Diet: 3-4 vegetable servings a day. Seldom eats [employer] foods Denies myalgias Lab Results  Component Value Date   CHOL 181 09/14/2018   HDL 52 09/14/2018   LDLCALC 110 (H) 09/14/2018   TRIG 94 09/14/2018   CHOLHDL 3.5 09/14/2018    Osteopenia Patient is taking Vitamin D and calcium supplementation.  Last fracture was 12/2017- avulsion of mid & hind foot, tip of fibula and talus. She fell when she missed her steps in the dark. She was released from orthopedic in December.    PHQ2/9: Depression screen [facility] 2/9 09/19/2018 09/14/2018 12/22/2017 12/06/2017 11/14/2017  Decreased Interest 0 0 0 0 0  Down, Depressed, Hopeless 0 0 0 0 0  PHQ - 2 Score 0 0 0 0 0  Altered sleeping 0 - - - -  Tired, decreased energy 0 - - - -  Change in appetite 0 - - - -  Feeling bad or failure about yourself  0 - - - -  Trouble concentrating 0 - - - -  Moving slowly or fidgety/restless 0 - - - -  Suicidal thoughts 0 - - - -  PHQ-9 Score 0 - - - -  Difficult doing work/chores Not difficult at all - - - -     PHQ reviewed. Negative  Patient Active Problem List   Diagnosis Date Noted  . Torus fracture of distal end of right fibula 01/18/2018  . Obesity (BMI 30.0-34.9) 12/22/2017  . Serrated polyp of colon 12/22/2017  . Osteopenia 11/04/2017  . Medicare annual wellness visit, initial 09/05/2017  . CKD (chronic kidney disease) stage 2, GFR 60-89  ml/min 09/05/2017  . Hyperlipidemia 08/25/2016  . Well woman exam without gynecological exam 06/24/2015  . Screening for breast cancer 06/24/2015  . Post-menopausal 06/24/2015    Past Medical History:  Diagnosis Date  . Hyperlipidemia   . Macular degeneration, age related   . Osteopenia 11/04/2017   July 2019    Past Surgical History:  Procedure Laterality Date  . COLONOSCOPY WITH PROPOFOL N/A 09/15/2017   Procedure: COLONOSCOPY WITH PROPOFOL;  Surgeon: Jonathon Bellows, MD;  Location: [facility] ENDOSCOPY;  Service: Gastroenterology;  Laterality: N/A;    Social History   Tobacco Use  . Smoking status: Never Smoker  . Smokeless tobacco: Never Used  Substance Use Topics  . Alcohol use: Yes    Alcohol/week: 0.0 standard drinks    Comment: occasional     Current Outpatient Medications:  .  Calcium Carb-Cholecalciferol (CALCIUM/VITAMIN D) 500-200 MG-UNIT TABS, Take 1 capsule by mouth daily., Disp: , Rfl:  .  Misc Natural Products (ADVANCED JOINT RELIEF) CAPS, Take by mouth., Disp: , Rfl:  .  Multiple Vitamin (MULTIVITAMIN) tablet, Take 1 tablet by mouth daily., Disp: , Rfl:  .  Multiple Vitamins-Minerals (PRESERVISION AREDS 2 PO), Take by mouth., Disp: , Rfl:  .  Omega-3  Fatty Acids (FISH OIL) 1000 MG CAPS, Take 2 capsules by mouth daily. , Disp: , Rfl:   Allergies  Allergen Reactions  . Latex Rash    ROS   No other specific complaints in a complete review of systems (except as listed in HPI above).  Objective  Vitals:   09/19/18 1030  BP: 116/70  Pulse: 67  Resp: 12  Temp: 98.3 F (36.8 C)  TempSrc: Oral  SpO2: 97%  Weight: 173 lb 1.6 oz (78.5 kg)  Height: 5\' 2"  (1.575 m)    Body mass index is 31.66 kg/m.  Nursing Note and Vital Signs reviewed.  Physical Exam Vitals signs reviewed.  Constitutional:      Appearance: She is well-developed.  HENT:     Head: Normocephalic and atraumatic.  Neck:     Musculoskeletal: Normal range of motion and neck supple.      Vascular: No carotid bruit.  Cardiovascular:     Heart sounds: Normal heart sounds.  Pulmonary:     Effort: Pulmonary effort is normal.     Breath sounds: Normal breath sounds.  Abdominal:     General: Bowel sounds are normal.     Palpations: Abdomen is soft.     Tenderness: There is no abdominal tenderness.  Musculoskeletal:     Left hip: She exhibits decreased range of motion (positive straight leg test). She exhibits normal strength, no tenderness, no bony tenderness, no swelling, no crepitus and no deformity.     Lumbar back: Normal.       Back:  Skin:    General: Skin is warm and dry.     Capillary Refill: Capillary refill takes less than 2 seconds.  Neurological:     Mental Status: She is alert and oriented to person, place, and time.     GCS: GCS eye subscore is 4. GCS verbal subscore is 5. GCS motor subscore is 6.     Sensory: No sensory deficit.  Psychiatric:        Speech: Speech normal.        Behavior: Behavior normal.        Thought Content: Thought content normal.        Judgment: Judgment normal.       No results found for this or any previous visit (from the past 48 hour(s)).  Assessment & Plan  1. Osteopenia of neck of femur, unspecified laterality Continue supplementation, weight bearing exercise  2. CKD (chronic kidney disease) stage 2, GFR 60-89 ml/min hydration  3. Mixed hyperlipidemia Discussed diet, LDL much improved  4. Sciatic nerve pain, left Discussed stretches, heat, cautioned on NSAID use- minimal, refer to PT if not improved in 4 weeks.  - meloxicam (MOBIC) 7.5 MG tablet; Take 1 tablet (7.5 mg total) by mouth daily.  Dispense: 30 tablet; Refill: 0

## 2018-09-19 NOTE — Patient Instructions (Signed)
- Please take mobic with food just as needed. Use heat to area before stretching  Sciatica Rehab Ask your health care provider which exercises are safe for you. Do exercises exactly as told by your health care provider and adjust them as directed. It is normal to feel mild stretching, pulling, tightness, or discomfort as you do these exercises, but you should stop right away if you feel sudden pain or your pain gets worse.Do not begin these exercises until told by your health care provider. Stretching and range of motion exercises These exercises warm up your muscles and joints and improve the movement and flexibility of your hips and your back. These exercises also help to relieve pain, numbness, and tingling. Exercise A: Sciatic nerve glide 1. Sit in a chair with your head facing down toward your chest. Place your hands behind your back. Let your shoulders slump forward. 2. Slowly straighten one of your knees while you tilt your head back as if you are looking toward the ceiling. Only straighten your leg as far as you can without making your symptoms worse. 3. Hold for __________ seconds. 4. Slowly return to the starting position. 5. Repeat with your other leg. Repeat __________ times. Complete this exercise __________ times a day. Exercise B: Knee to chest with hip adduction and internal rotation  1. Lie on your back on a firm surface with both legs straight. 2. Bend one of your knees and move it up toward your chest until you feel a gentle stretch in your lower back and buttock. Then, move your knee toward the shoulder that is on the opposite side from your leg. ? Hold your leg in this position by holding onto the front of your knee. 3. Hold for __________ seconds. 4. Slowly return to the starting position. 5. Repeat with your other leg. Repeat __________ times. Complete this exercise __________ times a day. Exercise C: Prone extension on elbows  1. Lie on your abdomen on a firm surface.  A bed may be too soft for this exercise. 2. Prop yourself up on your elbows. 3. Use your arms to help lift your chest up until you feel a gentle stretch in your abdomen and your lower back. ? This will place some of your body weight on your elbows. If this is uncomfortable, try stacking pillows under your chest. ? Your hips should stay down, against the surface that you are lying on. Keep your hip and back muscles relaxed. 4. Hold for __________ seconds. 5. Slowly relax your upper body and return to the starting position. Repeat __________ times. Complete this exercise __________ times a day. Strengthening exercises These exercises build strength and endurance in your back. Endurance is the ability to use your muscles for a long time, even after they get tired. Exercise D: Pelvic tilt 1. Lie on your back on a firm surface. Bend your knees and keep your feet flat. 2. Tense your abdominal muscles. Tip your pelvis up toward the ceiling and flatten your lower back into the floor. ? To help with this exercise, you may place a small towel under your lower back and try to push your back into the towel. 3. Hold for __________ seconds. 4. Let your muscles relax completely before you repeat this exercise. Repeat __________ times. Complete this exercise __________ times a day. Exercise E: Alternating arm and leg raises  1. Get on your hands and knees on a firm surface. If you are on a hard floor, you may want to  use padding to cushion your knees, such as an exercise mat. 2. Line up your arms and legs. Your hands should be below your shoulders, and your knees should be below your hips. 3. Lift your left leg behind you. At the same time, raise your right arm and straighten it in front of you. ? Do not lift your leg higher than your hip. ? Do not lift your arm higher than your shoulder. ? Keep your abdominal and back muscles tight. ? Keep your hips facing the ground. ? Do not arch your back. ? Keep your  balance carefully, and do not hold your breath. 4. Hold for __________ seconds. 5. Slowly return to the starting position and repeat with your right leg and your left arm. Repeat __________ times. Complete this exercise __________ times a day. Posture and body mechanics  Body mechanics refers to the movements and positions of your body while you do your daily activities. Posture is part of body mechanics. Good posture and healthy body mechanics can help to relieve stress in your body's tissues and joints. Good posture means that your spine is in its natural S-curve position (your spine is neutral), your shoulders are pulled back slightly, and your head is not tipped forward. The following are general guidelines for applying improved posture and body mechanics to your everyday activities. Standing   When standing, keep your spine neutral and your feet about hip-width apart. Keep a slight bend in your knees. Your ears, shoulders, and hips should line up.  When you do a task in which you stand in one place for a long time, place one foot up on a stable object that is 2-4 inches (5-10 cm) high, such as a footstool. This helps keep your spine neutral. Sitting   When sitting, keep your spine neutral and keep your feet flat on the floor. Use a footrest, if necessary, and keep your thighs parallel to the floor. Avoid rounding your shoulders, and avoid tilting your head forward.  When working at a desk or a computer, keep your desk at a height where your hands are slightly lower than your elbows. Slide your chair under your desk so you are close enough to maintain good posture.  When working at a computer, place your monitor at a height where you are looking straight ahead and you do not have to tilt your head forward or downward to look at the screen. Resting   When lying down and resting, avoid positions that are most painful for you.  If you have pain with activities such as sitting, bending,  stooping, or squatting (flexion-based activities), lie in a position in which your body does not bend very much. For example, avoid curling up on your side with your arms and knees near your chest (fetal position).  If you have pain with activities such as standing for a long time or reaching with your arms (extension-based activities), lie with your spine in a neutral position and bend your knees slightly. Try the following positions: ? Lying on your side with a pillow between your knees. ? Lying on your back with a pillow under your knees. Lifting   When lifting objects, keep your feet at least shoulder-width apart and tighten your abdominal muscles.  Bend your knees and hips and keep your spine neutral. It is important to lift using the strength of your legs, not your back. Do not lock your knees straight out.  Always ask for help to lift heavy or awkward  objects. This information is not intended to replace advice given to you by your health care provider. Make sure you discuss any questions you have with your health care provider. Document Released: 03/22/2005 Document Revised: 11/27/2015 Document Reviewed: 12/06/2014 Elsevier Interactive Patient Education  2019 [employer].

## 2018-11-10 ENCOUNTER — Ambulatory Visit
Admission: RE | Admit: 2018-11-10 | Discharge: 2018-11-10 | Disposition: A | Discharge status: Home / Self Care | Payer: Medicare HMO | Source: Ambulatory Visit | Attending: Nurse Practitioner | Admitting: Nurse Practitioner

## 2018-11-10 ENCOUNTER — Other Ambulatory Visit: Payer: Self-pay

## 2018-11-10 DIAGNOSIS — Z1231 Encounter for screening mammogram for malignant neoplasm of breast: Secondary | ICD-10-CM | POA: Diagnosis not present

## 2019-01-03 ENCOUNTER — Other Ambulatory Visit: Payer: Self-pay

## 2019-01-03 ENCOUNTER — Ambulatory Visit (INDEPENDENT_AMBULATORY_CARE_PROVIDER_SITE_OTHER): Payer: Medicare HMO

## 2019-01-03 DIAGNOSIS — Z23 Encounter for immunization: Secondary | ICD-10-CM

## 2019-01-12 DIAGNOSIS — R69 Illness, unspecified: Secondary | ICD-10-CM | POA: Diagnosis not present

## 2019-03-12 DIAGNOSIS — K219 Gastro-esophageal reflux disease without esophagitis: Secondary | ICD-10-CM | POA: Diagnosis not present

## 2019-03-12 DIAGNOSIS — Z825 Family history of asthma and other chronic lower respiratory diseases: Secondary | ICD-10-CM | POA: Diagnosis not present

## 2019-03-12 DIAGNOSIS — M1990 Unspecified osteoarthritis, unspecified site: Secondary | ICD-10-CM | POA: Diagnosis not present

## 2019-03-12 DIAGNOSIS — E669 Obesity, unspecified: Secondary | ICD-10-CM | POA: Diagnosis not present

## 2019-03-12 DIAGNOSIS — Z91040 Latex allergy status: Secondary | ICD-10-CM | POA: Diagnosis not present

## 2019-03-12 DIAGNOSIS — Z791 Long term (current) use of non-steroidal anti-inflammatories (NSAID): Secondary | ICD-10-CM | POA: Diagnosis not present

## 2019-03-23 ENCOUNTER — Other Ambulatory Visit: Payer: Self-pay

## 2019-03-23 ENCOUNTER — Ambulatory Visit (INDEPENDENT_AMBULATORY_CARE_PROVIDER_SITE_OTHER): Payer: Medicare HMO | Admitting: Family Medicine

## 2019-03-23 ENCOUNTER — Encounter: Payer: Self-pay | Admitting: Family Medicine

## 2019-03-23 ENCOUNTER — Telehealth: Payer: Self-pay | Admitting: Family Medicine

## 2019-03-23 VITALS — BP 116/82 | HR 97 | Temp 97.1°F | Resp 14 | Ht 62.0 in | Wt 179.2 lb

## 2019-03-23 DIAGNOSIS — E782 Mixed hyperlipidemia: Secondary | ICD-10-CM

## 2019-03-23 DIAGNOSIS — Z5181 Encounter for therapeutic drug level monitoring: Secondary | ICD-10-CM

## 2019-03-23 DIAGNOSIS — M25552 Pain in left hip: Secondary | ICD-10-CM | POA: Diagnosis not present

## 2019-03-23 DIAGNOSIS — M545 Low back pain, unspecified: Secondary | ICD-10-CM

## 2019-03-23 DIAGNOSIS — M85859 Other specified disorders of bone density and structure, unspecified thigh: Secondary | ICD-10-CM

## 2019-03-23 DIAGNOSIS — N182 Chronic kidney disease, stage 2 (mild): Secondary | ICD-10-CM | POA: Diagnosis not present

## 2019-03-23 DIAGNOSIS — E559 Vitamin D deficiency, unspecified: Secondary | ICD-10-CM

## 2019-03-23 NOTE — Patient Instructions (Signed)
Preventing Osteoporosis, Adult Osteoporosis is a condition that causes the bones to get weaker. With osteoporosis, the bones become thinner, and the normal spaces in bone tissue become larger. This can make the bones weak and cause them to break more easily. People who have osteoporosis are more likely to break their wrist, spine, or hip. Even a minor accident or injury can be enough to break weak bones. Osteoporosis can occur with aging. Your body constantly replaces old bone tissue with new tissue. As you get older, you may lose bone tissue more quickly, or it may be replaced more slowly. Osteoporosis is more likely to develop if you have poor nutrition or do not get enough calcium or vitamin D. Other lifestyle factors can also play a role. By making some diet and lifestyle changes, you can help to keep your bones healthy and help to prevent osteoporosis. What nutrition changes can be made? Nutrition plays an important role in maintaining healthy, strong bones.  Make sure you get enough calcium every day from food or from calcium supplements. ? If you are age 67 or younger, aim to get 1,000 mg of calcium every day. ? If you are older than age 67, aim to get 1,200 mg of calcium every day.  Try to get enough vitamin D every day. ? If you are age 67 or younger, aim to get 600 international units (IU) every day. ? If you are older than age 67, aim to get 800 international units every day.  Follow a healthy diet. Eat plenty of foods that contain calcium and vitamin D. ? Calcium is in milk, cheese, yogurt, and other dairy products. Some fish and vegetables are also good sources of calcium. Many foods such as cereals and breads have had calcium added to them (are fortified). Check nutrition labels to see how much calcium is in a food or drink. ? Foods that contain vitamin D include milk, cereals, salmon, and tuna. Your body also makes vitamin D when you are out in the sun. Bare skin exposure to the sun on  your face, arms, legs, or back for no more than 30 minutes a day, 2 times per week is more than enough. Beyond that, it is important to use sunblock to protect your skin from sunburn, which increases your risk for skin cancer. What lifestyle changes can be made? Making changes in your everyday life can also play an important role in preventing osteoporosis.  Stay active and get exercise every day. Ask your health care provider what types of exercise are best for you.  Do not use any products that contain nicotine or tobacco, such as cigarettes and e-cigarettes. If you need help quitting, ask your health care provider.  Limit alcohol intake to no more than 1 drink a day for nonpregnant women and 2 drinks a day for men. One drink equals 12 oz of beer, 5 oz of wine, or 1 oz of hard liquor. Why are these changes important? Making these nutrition and lifestyle changes can:  Help you develop and maintain healthy, strong bones.  Prevent loss of bone mass and the problems that are caused by that loss, such as broken bones and delayed healing.  Make you feel better mentally and physically. What can happen if changes are not made? Problems that can result from osteoporosis can be very serious. These may include:  A higher risk of broken bones that are painful and do not heal well.  Physical malformations, such as a collapsed spine  or a hunched back.  Problems with movement. Where to find support If you need help making changes to prevent osteoporosis, talk with your health care provider. You can ask for a referral to a diet and nutrition specialist (dietitian) and a physical therapist. Where to find more information Learn more about osteoporosis from:  NIH Osteoporosis and Related [place]: www.niams.GolfingGoddess.com.br  U.S. Office on Women's Health:  SouvenirBaseball.es.html  National Osteoporosis Foundation: ProfilePeek.ch Summary  Osteoporosis is a condition that causes weak bones that are more likely to break.  Eating a healthy diet and making sure you get enough calcium and vitamin D can help prevent osteoporosis.  Other ways to reduce your risk of osteoporosis include getting regular exercise and avoiding alcohol and products that contain nicotine or tobacco. This information is not intended to replace advice given to you by your health care provider. Make sure you discuss any questions you have with your health care provider. Document Released: 04/06/2015 Document Revised: 03/04/2017 Document Reviewed: 12/01/2015 Elsevier Patient Education  2020 [employer].

## 2019-03-23 NOTE — Progress Notes (Signed)
Name: Laura Oneill   MRN: [id number]    DOB: 09/30/1950   Date:03/23/2019       Progress Note  Chief Complaint  Patient presents with  . Follow-up     Subjective:   Laura Oneill is a 67 y.o. female, presents to clinic for routine follow up on the conditions listed above.   Pt new to me- reviewed last OV: Hyperlipidemia  Patient is taking fish oil 1000mg  BID. Diet: 3-4 vegetable servings a day. Seldom eats [employer] foods Denies myalgias Recent Labs       Lab Results  Component Value Date   CHOL 181 09/14/2018   HDL 52 09/14/2018   LDLCALC 110 (H) 09/14/2018   TRIG 94 09/14/2018   CHOLHDL 3.5 09/14/2018      Osteopenia Patient is taking Vitamin D and calcium supplementation.  Last fracture was 12/2017- avulsion of mid & hind foot, tip of fibula and talus. She fell when she missed her steps in the dark. She was released from orthopedic in December.    TODAY: Hyperlipidemia: Current Medication Regimen:  Fish oil supplement Last Lipids: Lab Results  Component Value Date   CHOL 181 09/14/2018   HDL 52 09/14/2018   LDLCALC 110 (H) 09/14/2018   TRIG 94 09/14/2018   CHOLHDL 3.5 09/14/2018  The 10-year ASCVD risk score Mikey Bussing DC Jr., et al., 2013) is: 5%   Values used to calculate the score:     Age: 54 years     Sex: Female     Is Non-Hispanic African American: No     Diabetic: No     Tobacco smoker: No     Systolic Blood Pressure: 99991111 mmHg     Is BP treated: No     HDL Cholesterol: 52 mg/dL     Total Cholesterol: 181 mg/dL Reviewed at length the 10-year cardiovascular risk calculator and factors that increase or decrease her risk and explained how we would recommend statin medication if her risk increases greater than 7.5% - Current Diet:  Healthy diet - Denies: Chest pain, shortness of breath, myalgias. - Documented aortic atherosclerosis? No - Risk factors for atherosclerosis: hypercholesterolemia  Osteopenia s/p fracture, still managed by  Calcium and Vit D supplement.  She is not due for repeat DEXA scan until later next year also due for mammogram in August since both of her previous providers are no longer at this clinic I did put in orders that I encouraged her to call [facility] to set up for August.  Patient states that she is no longer followed up with Ortho for over a year but she continues to have swelling in her right ankle after fracture about 14 months ago.  They did tell her that it might take 18 months to 2 years to have inflammation resolve or show her her new baseline.  Cold weather does seem to cause slightly more pain in her ankle.  Patient has history of vitamin D deficiency is taking over-the-counter vitamin D and calcium but she does not drink milk or have much dairy in her diet she does want to recheck her vitamin D level.  Also per chart has a history of CKD stage II is not on any blood pressure medications or any renal protective drugs.  Blood pressure is well controlled today with diet and lifestyle.   When leaving exam room patient stated that she has some acute complaints that she wanted to go over including severe leg pain that is gradually worsening  over many months shooting down both of her legs is constant worse at night, also associate with numbness which has worsened over the past 3 months.  She has had some low back pain in the past, and has a little bit currently if she stays in the same position for extended period of time does have some stiffness and shooting pain.  But no current low back pain and she denies any numbness tingling or restless leg symptoms.  She denies any history of iron deficiency, B12 deficiency, excessive EtOH use, denies any incontinence of urine or stool, abdominal pain, chronic steroid use, or any past back injury.   Patient Active Problem List   Diagnosis Date Noted  . Torus fracture of distal end of right fibula 01/18/2018  . Obesity (BMI 30.0-34.9) 12/22/2017  . Serrated polyp  of colon 12/22/2017  . Osteopenia 11/04/2017  . CKD (chronic kidney disease) stage 2, GFR 60-89 ml/min 09/05/2017  . Hyperlipidemia 08/25/2016  . Post-menopausal 06/24/2015    Past Surgical History:  Procedure Laterality Date  . COLONOSCOPY WITH PROPOFOL N/A 09/15/2017   Procedure: COLONOSCOPY WITH PROPOFOL;  Surgeon: Jonathon Bellows, MD;  Location[facility] ENDOSCOPY;  Service: Gastroenterology;  Laterality: N/A;    Family History  Problem Relation Age of Onset  . Hypothyroidism Mother   . COPD Father   . Breast cancer Neg Hx     Social History   Socioeconomic History  . Marital status: Married    Spouse name: Not on file  . Number of children: 2  . Years of education: Not on file  . Highest education level: High school graduate  Occupational History  . Occupation: retired  Tobacco Use  . Smoking status: Never Smoker  . Smokeless tobacco: Never Used  Substance and Sexual Activity  . Alcohol use: Yes    Alcohol/week: 0.0 standard drinks    Comment: occasional  . Drug use: No  . Sexual activity: Yes  Other Topics Concern  . Not on file  Social History Narrative  . Not on file   Social Determinants of Health   Financial Resource Strain: Low Risk   . Difficulty of Paying Living Expenses: Not hard at all  Food Insecurity: No Food Insecurity  . Worried About Charity fundraiser in the Last Year: Never true  . Ran Out of Food in the Last Year: Never true  Transportation Needs: No Transportation Needs  . Lack of Transportation (Medical): No  . Lack of Transportation (Non-Medical): No  Physical Activity: Inactive  . Days of Exercise per Week: 0 days  . Minutes of Exercise per Session: 0 min  Stress: No Stress Concern Present  . Feeling of Stress : Only a little  Social Connections: Slightly Isolated  . Frequency of Communication with Friends and Family: More than three times a week  . Frequency of Social Gatherings with Friends and Family: Three times a week  . Attends  Religious Services: More than 4 times per year  . Active Member of Clubs or Organizations: No  . Attends Archivist Meetings: Never  . Marital Status: Married  Human resources officer Violence: Not At Risk  . Fear of Current or Ex-Partner: No  . Emotionally Abused: No  . Physically Abused: No  . Sexually Abused: No     Current Outpatient Medications:  .  Calcium Carb-Cholecalciferol (CALCIUM/VITAMIN D) 500-200 MG-UNIT TABS, Take 1 capsule by mouth daily., Disp: , Rfl:  .  meloxicam (MOBIC) 7.5 MG tablet, Take 1 tablet (  7.5 mg total) by mouth daily., Disp: 30 tablet, Rfl: 0 .  Misc Natural Products (ADVANCED JOINT RELIEF) CAPS, Take by mouth., Disp: , Rfl:  .  Multiple Vitamin (MULTIVITAMIN) tablet, Take 1 tablet by mouth daily., Disp: , Rfl:  .  Multiple Vitamins-Minerals (PRESERVISION AREDS 2 PO), Take by mouth., Disp: , Rfl:  .  Omega-3 Fatty Acids (FISH OIL) 1000 MG CAPS, Take 1 capsule by mouth daily., Disp: , Rfl:   Allergies  Allergen Reactions  . Latex Rash    I personally reviewed active problem list, medication list, allergies, family history, social history, health maintenance, notes from last encounter, lab results, imaging with the patient/caregiver today.   Review of Systems  Constitutional: Negative.   HENT: Negative.   Eyes: Negative.   Respiratory: Negative.   Cardiovascular: Negative.   Gastrointestinal: Negative.   Endocrine: Negative.   Genitourinary: Negative.   Musculoskeletal: Negative.   Skin: Negative.   Allergic/Immunologic: Negative.   Neurological: Negative.   Hematological: Negative.   Psychiatric/Behavioral: Negative.   All other systems reviewed and are negative.    Objective:    Vitals:   03/23/19 0905  BP: 116/82  Pulse: 97  Resp: 14  Temp: (!) 97.1 F (36.2 C)  SpO2: 97%  Weight: 179 lb 3.2 oz (81.3 kg)  Height: 5\' 2"  (1.575 m)    Body mass index is 32.78 kg/m.  Physical Exam Vitals and nursing note reviewed.    Constitutional:      General: She is not in acute distress.    Appearance: Normal appearance. She is well-developed. She is not ill-appearing, toxic-appearing or diaphoretic.     Interventions: Face mask in place.  HENT:     Head: Normocephalic and atraumatic.     Right Ear: External ear normal.     Left Ear: External ear normal.  Eyes:     General: Lids are normal. No scleral icterus.       Right eye: No discharge.        Left eye: No discharge.     Conjunctiva/sclera: Conjunctivae normal.  Neck:     Trachea: Phonation normal. No tracheal deviation.  Cardiovascular:     Rate and Rhythm: Normal rate and regular rhythm.     Pulses: Normal pulses.          Radial pulses are 2+ on the right side and 2+ on the left side.       Posterior tibial pulses are 2+ on the right side and 2+ on the left side.     Heart sounds: Normal heart sounds. No murmur. No friction rub. No gallop.   Pulmonary:     Effort: Pulmonary effort is normal. No respiratory distress.     Breath sounds: Normal breath sounds. No stridor. No wheezing, rhonchi or rales.  Chest:     Chest wall: No tenderness.  Abdominal:     General: Bowel sounds are normal. There is no distension.     Palpations: Abdomen is soft.     Tenderness: There is no abdominal tenderness. There is no guarding or rebound.  Musculoskeletal:        General: No deformity. Normal range of motion.     Cervical back: Normal range of motion and neck supple.     Right lower leg: No edema.     Left lower leg: No edema.     Comments: No midline tenderness from cervical to lumbar spine, no step off No paraspinal muscle ttp from cervical to  lumbar spine No SI joint ttp Neg SLR b/l Grossly normal ROM of back 5/5 strength bilaterally with dorsiflexion, plantarflexion, flexion and extension at knees, and flexion and extension at hips Grossly normal sensation to light touch to bilateral lower extremities normal gait  Patient then pointed to her left  greater trochanter area stated that is where she had pain had her lay on the table flex her left leg and internally rotate her left hip this did cause some pain with palpation over greater trochanter area and IT band she did have tenderness to palpation  Lymphadenopathy:     Cervical: No cervical adenopathy.  Skin:    General: Skin is warm and dry.     Capillary Refill: Capillary refill takes less than 2 seconds.     Coloration: Skin is not jaundiced or pale.     Findings: No rash.  Neurological:     Mental Status: She is alert and oriented to person, place, and time.     Motor: No abnormal muscle tone.     Gait: Gait normal.  Psychiatric:        Speech: Speech normal.        Behavior: Behavior normal.      No results found for this or any previous visit (from the past 2160 hour(s)).   PHQ2/9: Depression screen [facility] 2/9 03/23/2019 09/19/2018 09/14/2018 12/22/2017 12/06/2017  Decreased Interest 0 0 0 0 0  Down, Depressed, Hopeless 0 0 0 0 0  PHQ - 2 Score 0 0 0 0 0  Altered sleeping 0 0 - - -  Tired, decreased energy 0 0 - - -  Change in appetite 0 0 - - -  Feeling bad or failure about yourself  0 0 - - -  Trouble concentrating 0 0 - - -  Moving slowly or fidgety/restless 0 0 - - -  Suicidal thoughts 0 0 - - -  PHQ-9 Score 0 0 - - -  Difficult doing work/chores Not difficult at all Not difficult at all - - -    phq 9 is negative reviewed  Fall Risk: Fall Risk  03/23/2019 09/19/2018 09/14/2018 12/22/2017 12/06/2017  Falls in the past year? 0 0 0 No No  Number falls in past yr: 0 0 0 - -  Injury with Fall? 0 0 0 - -  Follow up - - Falls prevention discussed - -    Functional Status Survey: Is the patient deaf or have difficulty hearing?: No Does the patient have difficulty seeing, even when wearing glasses/contacts?: No Does the patient have difficulty concentrating, remembering, or making decisions?: No Does the patient have difficulty walking or climbing stairs?: No Does the  patient have difficulty dressing or bathing?: No Does the patient have difficulty doing errands alone such as visiting a doctor's office or shopping?: No   Assessment & Plan:     ICD-10-CM   1. CKD (chronic kidney disease) stage 2, GFR 60-89 ml/min  N18.2 CMP w GFR   Recheck labs  2. Mixed hyperlipidemia  E78.2 CMP w GFR    Lipid Panel   Managed with fish oil discussed risk factors, diet lifestyle efforts rechecking labs will recalculate her 10-year ASCVD risk  3. Osteopenia of neck of femur, unspecified laterality  M85.859 CBC w/ Diff    CMP w GFR    Vit D    DG Bone Density   on vit d and calcium, discussed dosing and weightbearing exercises follow-up DEXA next August  4. Vitamin  D deficiency  E55.9 Vit D   supplementing recheck labs   5. Encounter for medication monitoring  Z51.81 CBC w/ Diff    CMP w GFR    Lipid Panel    Vit D  6. Left-sided low back pain without sciatica, unspecified chronicity  M54.5    Patient states that she believes she has low back pain with sciatica no radicular symptoms on exam good range of motion strength sensation  7. Greater trochanteric pain syndrome of left lower extremity  M25.552    Suspect some left greater trochanter bursitis and possible pain syndrome causing her leg pain and symptoms refer to Ortho her pref is [facility]   Greater than 50% of this visit was spent in direct face-to-face counseling, obtaining history and physical, discussing and educating pt on treatment plan, roughly 30 min spent with pt due to added on acute complaints at end of routine visit, 9:30 to 10:00 in exam room. Total time of this visit was >40.  Remainder of time involved but was not limited to reviewing chart (recent and pertinent OV notes and labs), documentation in EMR, and coordinating care and treatment plan, adding on additional charting and orders.   Patient initially complained of suspected low back pain with sciatica that radiated to her legs states her  pain is been severe gradually worsening over the past several months waking her up at night, having difficulty sleeping or laying on her side, radiates down her leg and associated with numbness.    With her concerns of back pain and sciatica we initially discussed doing physical therapy to treat.  After further discussion and physical exam she did finally point to her left hip stating that is where she had pain further exam techniques were done and her findings are more suspicious for a left trochanter bursitis or pain syndrome causing her pain and she is no concerning findings significant for any low back pain with sciatica she has no radicular symptoms and since Ortho can evaluate and give a local injection that may treat her pain without any need for physical therapy.  She sent a message prior to the end of business day today asking for orders to Ortho and physical therapy but I did send back a message explaining that Ortho will be able to evaluate and [facility] particularly will be able to have in-house physical therapy if they deem it necessary after their evaluation.   Return in about 6 months (around 09/21/2019) for Routine follow-up.   Delsa Grana, PA-C 03/23/19 9:13 AM

## 2019-03-23 NOTE — Telephone Encounter (Signed)
Pt.notified

## 2019-03-23 NOTE — Telephone Encounter (Signed)
Pt just left and wanted to remind you about putting in a referral about her side and physical therapy.

## 2019-03-24 LAB — COMPLETE METABOLIC PANEL WITH GFR
AG Ratio: 2.2 (calc) (ref 1.0–2.5)
ALT: 15 U/L (ref 6–29)
AST: 16 U/L (ref 10–35)
Albumin: 4.2 g/dL (ref 3.6–5.1)
Alkaline phosphatase (APISO): 55 U/L (ref 37–153)
BUN: 18 mg/dL (ref 7–25)
CO2: 26 mmol/L (ref 20–32)
Calcium: 9.6 mg/dL (ref 8.6–10.4)
Chloride: 106 mmol/L (ref 98–110)
Creat: 0.83 mg/dL (ref 0.50–0.99)
GFR, Est African American: 85 mL/min/{1.73_m2} (ref >=60)
GFR, Est Non African American: 73 mL/min/{1.73_m2} (ref >=60)
Globulin: 1.9 g/dL (calc) (ref 1.9–3.7)
Glucose, Bld: 90 mg/dL (ref 65–99)
Potassium: 4.5 mmol/L (ref 3.5–5.3)
Sodium: 141 mmol/L (ref 135–146)
Total Bilirubin: 0.5 mg/dL (ref 0.2–1.2)
Total Protein: 6.1 g/dL (ref 6.1–8.1)

## 2019-03-24 LAB — CBC WITH DIFFERENTIAL/PLATELET
Absolute Monocytes: 409 cells/uL (ref 200–950)
Basophils Absolute: 52 cells/uL (ref 0–200)
Basophils Relative: 1.1 %
Eosinophils Absolute: 150 cells/uL (ref 15–500)
Eosinophils Relative: 3.2 %
HCT: 44 % (ref 35.0–45.0)
Hemoglobin: 14.8 g/dL (ref 11.7–15.5)
Lymphs Abs: 1396 cells/uL (ref 850–3900)
MCH: 30.3 pg (ref 27.0–33.0)
MCHC: 33.6 g/dL (ref 32.0–36.0)
MCV: 90.2 fL (ref 80.0–100.0)
MPV: 9.6 fL (ref 7.5–12.5)
Monocytes Relative: 8.7 %
Neutro Abs: 2693 cells/uL (ref 1500–7800)
Neutrophils Relative %: 57.3 %
Platelets: 248 10*3/uL (ref 140–400)
RBC: 4.88 10*6/uL (ref 3.80–5.10)
RDW: 11.7 % (ref 11.0–15.0)
Total Lymphocyte: 29.7 %
WBC: 4.7 10*3/uL (ref 3.8–10.8)

## 2019-03-24 LAB — LIPID PANEL
Cholesterol: 234 mg/dL — ABNORMAL HIGH (ref <200)
HDL: 62 mg/dL (ref >=50)
LDL Cholesterol (Calc): 150 mg/dL (calc) — ABNORMAL HIGH
Non-HDL Cholesterol (Calc): 172 mg/dL (calc) — ABNORMAL HIGH (ref <130)
Total CHOL/HDL Ratio: 3.8 (calc) (ref <5.0)
Triglycerides: 105 mg/dL (ref <150)

## 2019-03-24 LAB — VITAMIN D 25 HYDROXY (VIT D DEFICIENCY, FRACTURES): Vit D, 25-Hydroxy: 52 ng/mL (ref 30–100)

## 2019-04-03 ENCOUNTER — Other Ambulatory Visit: Payer: Medicare HMO

## 2019-04-03 ENCOUNTER — Ambulatory Visit: Payer: Medicare HMO | Attending: Internal Medicine

## 2019-04-03 DIAGNOSIS — Z20822 Contact with and (suspected) exposure to covid-19: Secondary | ICD-10-CM

## 2019-04-03 DIAGNOSIS — Z20828 Contact with and (suspected) exposure to other viral communicable diseases: Secondary | ICD-10-CM | POA: Diagnosis not present

## 2019-04-04 LAB — NOVEL CORONAVIRUS, NAA: SARS-CoV-2, NAA: NOT DETECTED

## 2019-04-19 DIAGNOSIS — M5136 Other intervertebral disc degeneration, lumbar region: Secondary | ICD-10-CM | POA: Diagnosis not present

## 2019-04-19 DIAGNOSIS — M5416 Radiculopathy, lumbar region: Secondary | ICD-10-CM | POA: Diagnosis not present

## 2019-04-25 DIAGNOSIS — M545 Low back pain: Secondary | ICD-10-CM | POA: Diagnosis not present

## 2019-05-03 DIAGNOSIS — H2513 Age-related nuclear cataract, bilateral: Secondary | ICD-10-CM | POA: Diagnosis not present

## 2019-05-09 DIAGNOSIS — M545 Low back pain: Secondary | ICD-10-CM | POA: Diagnosis not present

## 2019-05-31 DIAGNOSIS — M7062 Trochanteric bursitis, left hip: Secondary | ICD-10-CM | POA: Diagnosis not present

## 2019-05-31 DIAGNOSIS — M5416 Radiculopathy, lumbar region: Secondary | ICD-10-CM | POA: Diagnosis not present

## 2019-07-26 DIAGNOSIS — R69 Illness, unspecified: Secondary | ICD-10-CM | POA: Diagnosis not present

## 2019-08-24 DIAGNOSIS — Z791 Long term (current) use of non-steroidal anti-inflammatories (NSAID): Secondary | ICD-10-CM | POA: Diagnosis not present

## 2019-08-24 DIAGNOSIS — R030 Elevated blood-pressure reading, without diagnosis of hypertension: Secondary | ICD-10-CM | POA: Diagnosis not present

## 2019-08-24 DIAGNOSIS — R69 Illness, unspecified: Secondary | ICD-10-CM | POA: Diagnosis not present

## 2019-08-24 DIAGNOSIS — E669 Obesity, unspecified: Secondary | ICD-10-CM | POA: Diagnosis not present

## 2019-08-24 DIAGNOSIS — H3530 Unspecified macular degeneration: Secondary | ICD-10-CM | POA: Diagnosis not present

## 2019-08-24 DIAGNOSIS — M1990 Unspecified osteoarthritis, unspecified site: Secondary | ICD-10-CM | POA: Diagnosis not present

## 2019-08-24 DIAGNOSIS — G8929 Other chronic pain: Secondary | ICD-10-CM | POA: Diagnosis not present

## 2019-08-24 DIAGNOSIS — Z91040 Latex allergy status: Secondary | ICD-10-CM | POA: Diagnosis not present

## 2019-09-18 ENCOUNTER — Other Ambulatory Visit: Payer: Self-pay

## 2019-09-18 ENCOUNTER — Ambulatory Visit (INDEPENDENT_AMBULATORY_CARE_PROVIDER_SITE_OTHER): Payer: Medicare HMO | Admitting: Family Medicine

## 2019-09-18 ENCOUNTER — Ambulatory Visit (INDEPENDENT_AMBULATORY_CARE_PROVIDER_SITE_OTHER): Payer: Medicare HMO

## 2019-09-18 ENCOUNTER — Encounter: Payer: Self-pay | Admitting: Family Medicine

## 2019-09-18 VITALS — BP 118/82 | HR 60 | Temp 97.1°F | Resp 16 | Ht 62.0 in | Wt 177.6 lb

## 2019-09-18 VITALS — BP 118/82 | HR 97 | Temp 98.1°F | Resp 14 | Ht 62.0 in | Wt 177.0 lb

## 2019-09-18 DIAGNOSIS — K469 Unspecified abdominal hernia without obstruction or gangrene: Secondary | ICD-10-CM

## 2019-09-18 DIAGNOSIS — E782 Mixed hyperlipidemia: Secondary | ICD-10-CM

## 2019-09-18 DIAGNOSIS — N182 Chronic kidney disease, stage 2 (mild): Secondary | ICD-10-CM | POA: Diagnosis not present

## 2019-09-18 DIAGNOSIS — Z1231 Encounter for screening mammogram for malignant neoplasm of breast: Secondary | ICD-10-CM | POA: Diagnosis not present

## 2019-09-18 DIAGNOSIS — Z0000 Encounter for general adult medical examination without abnormal findings: Secondary | ICD-10-CM

## 2019-09-18 DIAGNOSIS — M5432 Sciatica, left side: Secondary | ICD-10-CM

## 2019-09-18 LAB — COMPLETE METABOLIC PANEL WITH GFR
AG Ratio: 2.6 (calc) — ABNORMAL HIGH (ref 1.0–2.5)
ALT: 17 U/L (ref 6–29)
AST: 16 U/L (ref 10–35)
Albumin: 4.5 g/dL (ref 3.6–5.1)
Alkaline phosphatase (APISO): 54 U/L (ref 37–153)
BUN: 19 mg/dL (ref 7–25)
CO2: 28 mmol/L (ref 20–32)
Calcium: 9.4 mg/dL (ref 8.6–10.4)
Chloride: 106 mmol/L (ref 98–110)
Creat: 0.71 mg/dL (ref 0.50–0.99)
GFR, Est African American: 102 mL/min/{1.73_m2} (ref >=60)
GFR, Est Non African American: 88 mL/min/{1.73_m2} (ref >=60)
Globulin: 1.7 g/dL (calc) — ABNORMAL LOW (ref 1.9–3.7)
Glucose, Bld: 83 mg/dL (ref 65–99)
Potassium: 4.3 mmol/L (ref 3.5–5.3)
Sodium: 141 mmol/L (ref 135–146)
Total Bilirubin: 0.5 mg/dL (ref 0.2–1.2)
Total Protein: 6.2 g/dL (ref 6.1–8.1)

## 2019-09-18 LAB — LIPID PANEL
Cholesterol: 222 mg/dL — ABNORMAL HIGH (ref <200)
HDL: 59 mg/dL (ref >=50)
LDL Cholesterol (Calc): 146 mg/dL (calc) — ABNORMAL HIGH
Non-HDL Cholesterol (Calc): 163 mg/dL (calc) — ABNORMAL HIGH (ref <130)
Total CHOL/HDL Ratio: 3.8 (calc) (ref <5.0)
Triglycerides: 73 mg/dL (ref <150)

## 2019-09-18 MED ORDER — MELOXICAM 7.5 MG PO TABS: 7.5000 mg | ORAL_TABLET | Freq: Every day | ORAL | 3 refills | Status: DC | PRN

## 2019-09-18 NOTE — Progress Notes (Signed)
Name: ALEYA DURNELL   MRN: [id number]    DOB: Dec 21, 1950   Date:09/18/2019       Progress Note  Chief Complaint  Patient presents with  . Follow-up     Subjective:   SHANQUITA RONNING is a 69 y.o. female, presents to clinic for routine follow up on the conditions listed above.   Hyperlipidemia: Current Medication Regimen:not on meds, high ldl - 150 Last Lipids: Lab Results  Component Value Date   CHOL 234 (H) 03/23/2019   HDL 62 03/23/2019   LDLCALC 150 (H) 03/23/2019   TRIG 105 03/23/2019   CHOLHDL 3.8 03/23/2019  The 10-year ASCVD risk score Mikey Bussing DC Jr., et al., 2013) is: 6.1%   Values used to calculate the score:     Age: 86 years     Sex: Female     Is Non-Hispanic African American: No     Diabetic: No     Tobacco smoker: No     Systolic Blood Pressure: 841 mmHg     Is BP treated: No     HDL Cholesterol: 62 mg/dL     Total Cholesterol: 234 mg/dL  - Denies: Chest pain, shortness of breath, myalgias.   less red meat, more lean meats Still planning to work on exercising wants to do group exercise - looking forward to that   Leg pain and aches with more physical activity    Question about hernia upper midline   Patient Active Problem List   Diagnosis Date Noted  . Torus fracture of distal end of right fibula 01/18/2018  . Obesity (BMI 30.0-34.9) 12/22/2017  . Serrated polyp of colon 12/22/2017  . Osteopenia 11/04/2017  . CKD (chronic kidney disease) stage 2, GFR 60-89 ml/min 09/05/2017  . Hyperlipidemia 08/25/2016  . Post-menopausal 06/24/2015    Past Surgical History:  Procedure Laterality Date  . COLONOSCOPY WITH PROPOFOL N/A 09/15/2017   Procedure: COLONOSCOPY WITH PROPOFOL;  Surgeon: Jonathon Bellows, MD;  Location: [facility] ENDOSCOPY;  Service: Gastroenterology;  Laterality: N/A;    Family History  Problem Relation Age of Onset  . Hypothyroidism Mother   . COPD Father   . Breast cancer Neg Hx     Social History   Tobacco Use  . Smoking status:  Never Smoker  . Smokeless tobacco: Never Used  Vaping Use  . Vaping Use: Never used  Substance Use Topics  . Alcohol use: Yes    Alcohol/week: 0.0 standard drinks    Comment: occasional  . Drug use: No      Current Outpatient Medications:  .  Calcium Carb-Cholecalciferol (CALCIUM/VITAMIN D) 500-200 MG-UNIT TABS, Take 1 capsule by mouth daily., Disp: , Rfl:  .  meloxicam (MOBIC) 7.5 MG tablet, Take 1 tablet (7.5 mg total) by mouth daily., Disp: 30 tablet, Rfl: 0 .  Misc Natural Products (ADVANCED JOINT RELIEF) CAPS, Take by mouth., Disp: , Rfl:  .  Multiple Vitamin (MULTIVITAMIN) tablet, Take 1 tablet by mouth daily., Disp: , Rfl:  .  Multiple Vitamins-Minerals (PRESERVISION AREDS 2 PO), Take by mouth., Disp: , Rfl:  .  Omega-3 Fatty Acids (FISH OIL) 1000 MG CAPS, Take 1 capsule by mouth daily., Disp: , Rfl:   Allergies  Allergen Reactions  . Latex Rash    Chart Review Today: I personally reviewed active problem list, medication list, allergies, family history, social history, health maintenance, notes from last encounter, lab results, imaging with the patient/caregiver today.   Review of Systems  10 Systems reviewed and are  negative for acute change except as noted in the HPI.  Objective:    Vitals:   09/18/19 1102  BP: 118/82  Pulse: 97  Resp: 14  Temp: 98.1 F (36.7 C)  SpO2: 98%  Weight: 177 lb (80.3 kg)  Height: 5\' 2"  (1.575 m)    Body mass index is 32.37 kg/m.  Physical Exam Vitals and nursing note reviewed.  Constitutional:      General: She is not in acute distress.    Appearance: Normal appearance. She is well-developed. She is not ill-appearing, toxic-appearing or diaphoretic.     Interventions: Face mask in place.  HENT:     Head: Normocephalic and atraumatic.     Right Ear: External ear normal.     Left Ear: External ear normal.  Eyes:     General: Lids are normal. No scleral icterus.       Right eye: No discharge.        Left eye: No  discharge.     Conjunctiva/sclera: Conjunctivae normal.  Neck:     Trachea: Phonation normal. No tracheal deviation.  Cardiovascular:     Rate and Rhythm: Normal rate and regular rhythm.     Pulses: Normal pulses.          Radial pulses are 2+ on the right side and 2+ on the left side.       Posterior tibial pulses are 2+ on the right side and 2+ on the left side.     Heart sounds: Normal heart sounds. No murmur heard.  No friction rub. No gallop.   Pulmonary:     Effort: Pulmonary effort is normal. No respiratory distress.     Breath sounds: Normal breath sounds. No stridor. No wheezing, rhonchi or rales.  Chest:     Chest wall: No tenderness.  Abdominal:     General: Bowel sounds are normal. There is no distension.     Palpations: Abdomen is soft.     Tenderness: There is no abdominal tenderness. There is no guarding or rebound.     Hernia: A hernia is present.  Musculoskeletal:     Cervical back: Normal range of motion and neck supple.     Right lower leg: No edema.     Left lower leg: No edema.  Lymphadenopathy:     Cervical: No cervical adenopathy.  Skin:    General: Skin is warm and dry.     Coloration: Skin is not jaundiced or pale.     Findings: No rash.  Neurological:     Mental Status: She is alert. Mental status is at baseline.     Sensory: No sensory deficit.     Motor: No weakness or abnormal muscle tone.     Coordination: Coordination normal.     Gait: Gait normal.  Psychiatric:        Mood and Affect: Mood normal.        Speech: Speech normal.        Behavior: Behavior normal.        PHQ2/9: Depression screen [facility] 2/9 09/18/2019 03/23/2019 09/19/2018 09/14/2018 12/22/2017  Decreased Interest 0 0 0 0 0  Down, Depressed, Hopeless 0 0 0 0 0  PHQ - 2 Score 0 0 0 0 0  Altered sleeping - 0 0 - -  Tired, decreased energy - 0 0 - -  Change in appetite - 0 0 - -  Feeling bad or failure about yourself  - 0 0 - -  Trouble  concentrating - 0 0 - -  Moving slowly  or fidgety/restless - 0 0 - -  Suicidal thoughts - 0 0 - -  PHQ-9 Score - 0 0 - -  Difficult doing work/chores - Not difficult at all Not difficult at all - -    phq 9 is neg, reviewed   Fall Risk: Fall Risk  09/18/2019 03/23/2019 09/19/2018 09/14/2018 12/22/2017  Falls in the past year? 0 0 0 0 No  Number falls in past yr: 0 0 0 0 -  Injury with Fall? 0 0 0 0 -  Risk for fall due to : No Fall Risks - - - -  Follow up Falls prevention discussed - - Falls prevention discussed -    Functional Status Survey:     Assessment & Plan:     ICD-10-CM   1. Mixed hyperlipidemia  E78.2 Lipid panel    COMPLETE METABOLIC PANEL WITH GFR   No meds, working on diet and exercise efforts, recheck labs today and recalculate risk  2. Encounter for Medicare annual wellness exam  Z00.00    Done with Kasey  3. CKD (chronic kidney disease) stage 2, GFR 60-89 ml/min  K99.8 COMPLETE METABOLIC PANEL WITH GFR   Monitor renal function, patient's blood pressure is well controlled avoiding NSAIDs and remaining well-hydrated  4. Sciatic nerve pain, left  M54.32 meloxicam (MOBIC) 7.5 MG tablet   Continued leg pain worse with activity likely sciatic in nature, offered PT, reviewed stretches  5. Abdominal hernia without obstruction and without gangrene, recurrence not specified, unspecified hernia type  K46.9 Ambulatory referral to General Surgery   Patient mentions abdominal hernia seems to have worsened she requests referral to general surgery     Return for 6 month f/up for HLD .   Delsa Grana, PA-C 09/18/19 11:33 AM

## 2019-09-18 NOTE — Patient Instructions (Signed)
Preventing Osteoporosis, Adult Osteoporosis is a condition that causes the bones to lose density. This means that the bones become thinner, and the normal spaces in bone tissue become larger. Low bone density can make the bones weak and cause them to break more easily. Osteoporosis cannot always be prevented, but you can take steps to lower your risk of developing this condition. How can this condition affect me? If you develop osteoporosis, you will be more likely to break bones in your wrist, spine, or hip. Even a minor accident or injury can be enough to break weak bones. The bones will also be slower to heal. Osteoporosis can cause other problems as well, such as a stooped posture or trouble with movement. Osteoporosis can occur with aging. As you get older, you may lose bone tissue more quickly, or it may be replaced more slowly. Osteoporosis is more likely to develop if you have poor nutrition or do not get enough calcium or vitamin D. Other lifestyle factors can also play a role. By eating a well-balanced diet and making lifestyle changes, you can help keep your bones strong and healthy, lowering your chances of developing osteoporosis. What can increase my risk? The following factors may make you more likely to develop osteoporosis:  Having a family history of the condition.  Having poor nutrition or not getting enough calcium or vitamin D.  Using certain medicines, such as steroid medicines or antiseizure medicines.  Being any of the following: ? 91 years of age or older. ? Female. ? A woman who has gone through menopause (is postmenopausal). ? White (Caucasian) or of Asian descent.  Smoking or having a history of smoking.  Not being physically active (being sedentary).  Having a small body frame. What actions can I take to prevent this?  Get enough calcium   Make sure you get enough calcium every day. Calcium is the most important mineral for bone health. Most people can get  enough calcium from their diet, but supplements may be recommended for people who are at risk for osteoporosis. Follow these guidelines: ? If you are age 44 or younger, aim to get 1,000 mg of calcium every day. ? If you are older than age 49, aim to get 1,200 mg of calcium every day.  Good sources of calcium include: ? Dairy products, such as low-fat or nonfat milk, cheese, and yogurt. ? Dark green leafy vegetables, such as bok choy and broccoli. ? Foods that have had calcium added to them (calcium-fortified foods), such as orange juice, cereal, bread, soy beverages, and tofu products. ? Nuts, such as almonds.  Check nutrition labels to see how much calcium is in a food or drink. Get enough vitamin D  Try to get enough vitamin D every day. Vitamin D is the most essential vitamin for bone health. It helps the body absorb calcium. Follow these guidelines for how much vitamin D to get from food: ? If you are age 106 or younger, aim to get at least 600 international units (IU) every day. Your health care provider may suggest more. ? If you are older than age 64, aim to get at least 800 international units every day. Your health care provider may suggest more.  Good sources of vitamin D in your diet include: ? Egg yolks. ? Oily fish, such as salmon, sardines, and tuna. ? Milk and cereal fortified with vitamin D.  Your body also makes vitamin D when you are out in the sun. Exposing the bare  skin on your face, arms, legs, or back to the sun for no more than 30 minutes a day, 2 times a week is more than enough. Beyond that, make sure you use sunblock to protect your skin from sunburn, which increases your risk for skin cancer. Exercise  Stay active and get exercise every day.  Ask your health care provider what types of exercise are best for you. Weight-bearing and strength-building activities are important for building and maintaining healthy bones. Some examples of these types of activities  include: ? Walking and hiking. ? Jogging and running. ? Dancing. ? Gym exercises. ? Lifting weights. ? Tennis and racquetball. ? Climbing stairs. ? Aerobics. Make other lifestyle changes  Do not use any products that contain nicotine or tobacco, such as cigarettes, e-cigarettes, and chewing tobacco. If you need help quitting, ask your health care provider.  Lose weight if you are overweight.  If you drink alcohol: ? Limit how much you use to:  0-1 drink a day for nonpregnant women.  0-2 drinks a day for men. ? Be aware of how much alcohol is in your drink. In the [place] one drink equals one 12 oz bottle of beer (355 mL), one 5 oz glass of wine (148 mL), or one 1 oz glass of hard liquor (44 mL). Where to find support If you need help making changes to prevent osteoporosis, talk with your health care provider. You can ask for a referral to a diet and nutrition specialist (dietitian) and a physical therapist. Where to find more information Learn more about osteoporosis from:  NIH Osteoporosis and Related Bone Diseases National Resource Center: www.bones.nih.gov  [place] Office on Women's Health: www.womenshealth.gov  National Osteoporosis Foundation: www.nof.org Summary  Osteoporosis is a condition that causes weak bones that are more likely to break.  Eat a healthy diet, making sure you get enough calcium and vitamin D, and stay active by getting regular exercise to help prevent osteoporosis.  Other ways to reduce your risk of osteoporosis include maintaining a healthy weight and avoiding alcohol and products that contain nicotine or tobacco. This information is not intended to replace advice given to you by your health care provider. Make sure you discuss any questions you have with your health care provider. Document Revised: 10/20/2018 Document Reviewed: 10/20/2018 Elsevier Patient Education  2020 Elsevier Inc.  

## 2019-09-18 NOTE — Patient Instructions (Signed)
Laura Oneill , Thank you for taking time to come for your Medicare Wellness Visit. I appreciate your ongoing commitment to your health goals. Please review the following plan we discussed and let me know if I can assist you in the future.   Screening recommendations/referrals: Colonoscopy: done 09/15/17. Repeat in 2022 Mammogram: done 11/10/18. Please call [phone] to schedule your mammogram and bone density screening Bone Density: done 11/01/17 Recommended yearly ophthalmology/optometry visit for glaucoma screening and checkup Recommended yearly dental visit for hygiene and checkup  Vaccinations: Influenza vaccine: done 01/03/19 Pneumococcal vaccine: done 09/14/18 Tdap vaccine: done 01/26/13 Shingles vaccine: Shingrix discussed. Please contact your pharmacy for coverage information.  Covid-19:done 05/14/19 & 06/11/19   Advanced directives: Please bring a copy of your health care power of attorney and living will to the office at your convenience once you have completed those documents  Conditions/risks identified: Recommend increasing physical activity to at least 3 days per week  Next appointment: Follow up in one year for your annual wellness visit    Preventive Care 65 Years and Older, Female Preventive care refers to lifestyle choices and visits with your health care provider that can promote health and wellness. What does preventive care include?  A yearly physical exam. This is also called an annual well check.  Dental exams once or twice a year.  Routine eye exams. Ask your health care provider how often you should have your eyes checked.  Personal lifestyle choices, including:  Daily care of your teeth and gums.  Regular physical activity.  Eating a healthy diet.  Avoiding tobacco and drug use.  Limiting alcohol use.  Practicing safe sex.  Taking low-dose aspirin every day.  Taking vitamin and mineral supplements as recommended by your health care provider. What  happens during an annual well check? The services and screenings done by your health care provider during your annual well check will depend on your age, overall health, lifestyle risk factors, and family history of disease. Counseling  Your health care provider may ask you questions about your:  Alcohol use.  Tobacco use.  Drug use.  Emotional well-being.  Home and relationship well-being.  Sexual activity.  Eating habits.  History of falls.  Memory and ability to understand (cognition).  Work and work Statistician.  Reproductive health. Screening  You may have the following tests or measurements:  Height, weight, and BMI.  Blood pressure.  Lipid and cholesterol levels. These may be checked every 5 years, or more frequently if you are over 75 years old.  Skin check.  Lung cancer screening. You may have this screening every year starting at age 10 if you have a 30-pack-year history of smoking and currently smoke or have quit within the past 15 years.  Fecal occult blood test (FOBT) of the stool. You may have this test every year starting at age 71.  Flexible sigmoidoscopy or colonoscopy. You may have a sigmoidoscopy every 5 years or a colonoscopy every 10 years starting at age 70.  Hepatitis C blood test.  Hepatitis B blood test.  Sexually transmitted disease (STD) testing.  Diabetes screening. This is done by checking your blood sugar (glucose) after you have not eaten for a while (fasting). You may have this done every 1-3 years.  Bone density scan. This is done to screen for osteoporosis. You may have this done starting at age 39.  Mammogram. This may be done every 1-2 years. Talk to your health care provider about how often you should have  regular mammograms. Talk with your health care provider about your test results, treatment options, and if necessary, the need for more tests. Vaccines  Your health care provider may recommend certain vaccines, such  as:  Influenza vaccine. This is recommended every year.  Tetanus, diphtheria, and acellular pertussis (Tdap, Td) vaccine. You may need a Td booster every 10 years.  Zoster vaccine. You may need this after age 55.  Pneumococcal 13-valent conjugate (PCV13) vaccine. One dose is recommended after age 50.  Pneumococcal polysaccharide (PPSV23) vaccine. One dose is recommended after age 68. Talk to your health care provider about which screenings and vaccines you need and how often you need them. This information is not intended to replace advice given to you by your health care provider. Make sure you discuss any questions you have with your health care provider. Document Released: 04/18/2015 Document Revised: 12/10/2015 Document Reviewed: 01/21/2015 Elsevier Interactive Patient Education  2017 [place] Prevention in the Home Falls can cause injuries. They can happen to people of all ages. There are many things you can do to make your home safe and to help prevent falls. What can I do on the outside of my home?  Regularly fix the edges of walkways and driveways and fix any cracks.  Remove anything that might make you trip as you walk through a door, such as a raised step or threshold.  Trim any bushes or trees on the path to your home.  Use bright outdoor lighting.  Clear any walking paths of anything that might make someone trip, such as rocks or tools.  Regularly check to see if handrails are loose or broken. Make sure that both sides of any steps have handrails.  Any raised decks and porches should have guardrails on the edges.  Have any leaves, snow, or ice cleared regularly.  Use sand or salt on walking paths during winter.  Clean up any spills in your garage right away. This includes oil or grease spills. What can I do in the bathroom?  Use night lights.  Install grab bars by the toilet and in the tub and shower. Do not use towel bars as grab bars.  Use  non-skid mats or decals in the tub or shower.  If you need to sit down in the shower, use a plastic, non-slip stool.  Keep the floor dry. Clean up any water that spills on the floor as soon as it happens.  Remove soap buildup in the tub or shower regularly.  Attach bath mats securely with double-sided non-slip rug tape.  Do not have throw rugs and other things on the floor that can make you trip. What can I do in the bedroom?  Use night lights.  Make sure that you have a light by your bed that is easy to reach.  Do not use any sheets or blankets that are too big for your bed. They should not hang down onto the floor.  Have a firm chair that has side arms. You can use this for support while you get dressed.  Do not have throw rugs and other things on the floor that can make you trip. What can I do in the kitchen?  Clean up any spills right away.  Avoid walking on wet floors.  Keep items that you use a lot in easy-to-reach places.  If you need to reach something above you, use a strong step stool that has a grab bar.  Keep electrical cords out of the  way.  Do not use floor polish or wax that makes floors slippery. If you must use wax, use non-skid floor wax.  Do not have throw rugs and other things on the floor that can make you trip. What can I do with my stairs?  Do not leave any items on the stairs.  Make sure that there are handrails on both sides of the stairs and use them. Fix handrails that are broken or loose. Make sure that handrails are as long as the stairways.  Check any carpeting to make sure that it is firmly attached to the stairs. Fix any carpet that is loose or worn.  Avoid having throw rugs at the top or bottom of the stairs. If you do have throw rugs, attach them to the floor with carpet tape.  Make sure that you have a light switch at the top of the stairs and the bottom of the stairs. If you do not have them, ask someone to add them for you. What  else can I do to help prevent falls?  Wear shoes that:  Do not have high heels.  Have rubber bottoms.  Are comfortable and fit you well.  Are closed at the toe. Do not wear sandals.  If you use a stepladder:  Make sure that it is fully opened. Do not climb a closed stepladder.  Make sure that both sides of the stepladder are locked into place.  Ask someone to hold it for you, if possible.  Clearly mark and make sure that you can see:  Any grab bars or handrails.  First and last steps.  Where the edge of each step is.  Use tools that help you move around (mobility aids) if they are needed. These include:  Canes.  Walkers.  Scooters.  Crutches.  Turn on the lights when you go into a dark area. Replace any light bulbs as soon as they burn out.  Set up your furniture so you have a clear path. Avoid moving your furniture around.  If any of your floors are uneven, fix them.  If there are any pets around you, be aware of where they are.  Review your medicines with your doctor. Some medicines can make you feel dizzy. This can increase your chance of falling. Ask your doctor what other things that you can do to help prevent falls. This information is not intended to replace advice given to you by your health care provider. Make sure you discuss any questions you have with your health care provider. Document Released: 01/16/2009 Document Revised: 08/28/2015 Document Reviewed: 04/26/2014 Elsevier Interactive Patient Education  2017 [employer].

## 2019-09-18 NOTE — Progress Notes (Signed)
Subjective:   Laura Oneill is a 69 y.o. female who presents for Medicare Annual (Subsequent) preventive examination.  Review of Systems:   Cardiac Risk Factors include: advanced age (>73men, >70 women);obesity (BMI >30kg/m2)     Objective:     Vitals: BP 118/82   Pulse 60   Temp (!) 97.1 F (36.2 C) (Temporal)   Resp 16   Ht 5\' 2"  (1.575 m)   Wt 177 lb 9.6 oz (80.6 kg)   SpO2 98%   BMI 32.48 kg/m   Body mass index is 32.48 kg/m.  Advanced Directives 09/18/2019 09/14/2018 12/24/2017 09/15/2017 08/25/2016 08/25/2016 06/24/2015  Does Patient Have a Medical Advance Directive? No No No No No No Yes  Type of Advance Directive - - - - - - Living will  Does patient want to make changes to medical advance directive? - No - Patient declined - - - - No - Patient declined  Copy of [place] in Chart? - - - - - - No - copy requested  Would patient like information on creating a medical advance directive? No - Patient declined - - No - Patient declined - - -    Tobacco Social History   Tobacco Use  Smoking Status Never Smoker  Smokeless Tobacco Never Used     Counseling given: Not Answered   Clinical Intake:  Pre-visit preparation completed: Yes  Pain : No/denies pain     BMI - recorded: 32.48 Nutritional Status: BMI > 30  Obese Nutritional Risks: None Diabetes: No  How often do you need to have someone help you when you read instructions, pamphlets, or other written materials from your doctor or pharmacy?: 1 - Never  Interpreter Needed?: No  Information entered by :: Clemetine Marker LPN  Past Medical History:  Diagnosis Date  . Hyperlipidemia   . Macular degeneration, age related   . Osteopenia 11/04/2017   July 2019   Past Surgical History:  Procedure Laterality Date  . COLONOSCOPY WITH PROPOFOL N/A 09/15/2017   Procedure: COLONOSCOPY WITH PROPOFOL;  Surgeon: Jonathon Bellows, MD;  Location: [facility] ENDOSCOPY;  Service: Gastroenterology;  Laterality: N/A;    Family History  Problem Relation Age of Onset  . Hypothyroidism Mother   . COPD Father   . Breast cancer Neg Hx    Social History   Socioeconomic History  . Marital status: Married    Spouse name: Not on file  . Number of children: 2  . Years of education: Not on file  . Highest education level: High school graduate  Occupational History  . Occupation: retired  Tobacco Use  . Smoking status: Never Smoker  . Smokeless tobacco: Never Used  Vaping Use  . Vaping Use: Never used  Substance and Sexual Activity  . Alcohol use: Yes    Alcohol/week: 0.0 standard drinks    Comment: occasional  . Drug use: No  . Sexual activity: Yes  Other Topics Concern  . Not on file  Social History Narrative  . Not on file   Social Determinants of Health   Financial Resource Strain: Low Risk   . Difficulty of Paying Living Expenses: Not hard at all  Food Insecurity: No Food Insecurity  . Worried About Charity fundraiser in the Last Year: Never true  . Ran Out of Food in the Last Year: Never true  Transportation Needs: No Transportation Needs  . Lack of Transportation (Medical): No  . Lack of Transportation (Non-Medical): No  Physical  Activity: Inactive  . Days of Exercise per Week: 0 days  . Minutes of Exercise per Session: 0 min  Stress: No Stress Concern Present  . Feeling of Stress : Not at all  Social Connections: Moderately Integrated  . Frequency of Communication with Friends and Family: More than three times a week  . Frequency of Social Gatherings with Friends and Family: Three times a week  . Attends Religious Services: More than 4 times per year  . Active Member of Clubs or Organizations: No  . Attends Archivist Meetings: Never  . Marital Status: Married    Outpatient Encounter Medications as of 09/18/2019  Medication Sig  . Calcium Carb-Cholecalciferol (CALCIUM/VITAMIN D) 500-200 MG-UNIT TABS Take 1 capsule by mouth daily.  . meloxicam (MOBIC) 7.5 MG tablet  Take 1 tablet (7.5 mg total) by mouth daily.  . Misc Natural Products (ADVANCED JOINT RELIEF) CAPS Take by mouth.  . Multiple Vitamin (MULTIVITAMIN) tablet Take 1 tablet by mouth daily.  . Multiple Vitamins-Minerals (PRESERVISION AREDS 2 PO) Take by mouth.  . Omega-3 Fatty Acids (FISH OIL) 1000 MG CAPS Take 1 capsule by mouth daily.   No facility-administered encounter medications on file as of 09/18/2019.    Activities of Daily Living In your present state of health, do you have any difficulty performing the following activities: 09/18/2019 03/23/2019  Hearing? N N  Comment declines hearing aids -  Vision? N N  Difficulty concentrating or making decisions? N N  Walking or climbing stairs? N N  Dressing or bathing? N N  Doing errands, shopping? N N  Preparing Food and eating ? N -  Using the Toilet? N -  In the past six months, have you accidently leaked urine? N -  Do you have problems with loss of bowel control? N -  Managing your Medications? N -  Managing your Finances? N -  Housekeeping or managing your Housekeeping? N -  Some recent data might be hidden    Patient Care Team: Delsa Grana, PA-C as PCP - General (Family Medicine)    Assessment:   This is a routine wellness examination for [place].  Exercise Activities and Dietary recommendations Exercise limited by: orthopedic condition(s)  Goals    .  Weight (lb) < 160 lb (72.6 kg) (pt-stated)       Fall Risk Fall Risk  09/18/2019 03/23/2019 09/19/2018 09/14/2018 12/22/2017  Falls in the past year? 0 0 0 0 No  Number falls in past yr: 0 0 0 0 -  Injury with Fall? 0 0 0 0 -  Risk for fall due to : No Fall Risks - - - -  Follow up Falls prevention discussed - - Falls prevention discussed -   FALL RISK PREVENTION PERTAINING TO THE HOME:  Any stairs in or around the home? Yes  If so, are there any without handrails? Yes   Home free of loose throw rugs in walkways, pet beds, electrical cords, etc? Yes  Adequate  lighting in your home to reduce risk of falls? Yes   ASSISTIVE DEVICES UTILIZED TO PREVENT FALLS:  Life alert? No  Use of a cane, walker or w/c? No  Grab bars in the bathroom? Yes  Shower chair or bench in shower? Yes  Elevated toilet seat or a handicapped toilet? No   DME ORDERS:  DME order needed?  No   TIMED UP AND GO:  Was the test performed? Yes .  Length of time to ambulate 10 feet: 5  sec.   GAIT:  Appearance of gait: Gait steady and fast without the use of an assistive device.   Education: Fall risk prevention has been discussed.  Intervention(s) required? No     Depression Screen PHQ 2/9 Scores 09/18/2019 03/23/2019 09/19/2018 09/14/2018  PHQ - 2 Score 0 0 0 0  PHQ- 9 Score - 0 0 -     Cognitive Function     6CIT Screen 09/18/2019 09/14/2018 09/05/2017  What Year? 0 points 0 points 0 points  What month? 0 points 0 points 0 points  What time? 0 points 0 points 0 points  Count back from 20 0 points 0 points 0 points  Months in reverse 0 points 2 points 0 points  Repeat phrase 0 points 0 points 2 points  Total Score 0 2 2    Immunization History  Administered Date(s) Administered  . Fluad Quad(high Dose 65+) 01/03/2019  . Influenza, High Dose Seasonal PF 01/18/2018  . Influenza,inj,Quad PF,6+ Mos 12/25/2014, 01/22/2016, 01/10/2017  . Moderna SARS-COVID-2 Vaccination 05/14/2019, 06/11/2019  . Pneumococcal Conjugate-13 09/05/2017  . Pneumococcal Polysaccharide-23 09/14/2018  . Tdap 01/26/2013  . Zoster 12/25/2014    Qualifies for Shingles Vaccine?Yes  . Due for Shingrix. Education has been provided regarding the importance of this vaccine. Pt has been advised to call insurance company to determine out of pocket expense. Advised may also receive vaccine at local pharmacy or Health Dept. Verbalized acceptance and understanding.  Tdap: Up to date  Flu Vaccine: Up to date  Pneumococcal Vaccine: Up to date  Covid-19 Vaccine: Up to date   Screening  Tests Health Maintenance  Topic Date Due  . DEXA SCAN  11/02/2019  . INFLUENZA VACCINE  11/04/2019  . MAMMOGRAM  11/10/2019  . COLONOSCOPY  09/15/2020  . TETANUS/TDAP  01/27/2023  . COVID-19 Vaccine  Completed  . Hepatitis C Screening  Completed  . PNA vac Low Risk Adult  Completed    Cancer Screenings:  Colorectal Screening: Completed 09/15/17. Repeat every 3 years;   Mammogram: Completed 11/10/18. Repeat every year. Ordered today. Pt provided with contact information and advised to call to schedule appt.   Bone Density: Completed 11/01/17. Results reflect  OSTEOPENIA. Repeat every 2 years. Ordered 03/23/19. Pt provided with contact information and advised to call to schedule appt.   Lung Cancer Screening: (Low Dose CT Chest recommended if Age 46-80 years, 30 pack-year currently smoking OR have quit w/in 15years.) does not qualify.   Additional Screening:  Hepatitis C Screening: does qualify; Completed 08/25/16  Vision Screening: Recommended annual ophthalmology exams for early detection of glaucoma and other disorders of the eye. Is the patient up to date with their annual eye exam?  Yes  Who is the provider or what is the name of the office in which the pt attends annual eye exams? [place] Screening: Recommended annual dental exams for proper oral hygiene  Community Resource Referral:  CRR required this visit?  No      Plan:     I have personally reviewed and addressed the Medicare Annual Wellness questionnaire and have noted the following in the patient's chart:  A. Medical and social history B. Use of alcohol, tobacco or illicit drugs  C. Current medications and supplements D. Functional ability and status E.  Nutritional status F.  Physical activity G. Advance directives H. List of other physicians I.  Hospitalizations, surgeries, and ER visits in previous 12 months J.  Vitals K. Screenings such as hearing  and vision if needed, cognitive and  depression L. Referrals and appointments   In addition, I have reviewed and discussed with patient certain preventive protocols, quality metrics, and best practice recommendations. A written personalized care plan for preventive services as well as general preventive health recommendations were provided to patient.   Signed,  Clemetine Marker, LPN Nurse Health Advisor   Nurse Notes: none

## 2019-09-25 ENCOUNTER — Ambulatory Visit (INDEPENDENT_AMBULATORY_CARE_PROVIDER_SITE_OTHER): Payer: Medicare HMO | Admitting: General Surgery

## 2019-09-25 ENCOUNTER — Encounter: Payer: Self-pay | Admitting: General Surgery

## 2019-09-25 ENCOUNTER — Telehealth: Payer: Self-pay | Admitting: General Surgery

## 2019-09-25 ENCOUNTER — Other Ambulatory Visit
Admission: RE | Admit: 2019-09-25 | Discharge: 2019-09-25 | Disposition: A | Discharge status: Home / Self Care | Payer: Medicare HMO | Source: Ambulatory Visit | Attending: General Surgery | Admitting: General Surgery

## 2019-09-25 ENCOUNTER — Other Ambulatory Visit: Payer: Self-pay

## 2019-09-25 DIAGNOSIS — R1013 Epigastric pain: Secondary | ICD-10-CM

## 2019-09-25 LAB — CREATININE, SERUM
Creatinine, Ser: 0.72 mg/dL (ref 0.44–1.00)
GFR calc Af Amer: >60 mL/min (ref >60)
GFR calc non Af Amer: >60 mL/min (ref >60)

## 2019-09-25 LAB — BUN: BUN: 20 mg/dL (ref 8–23)

## 2019-09-25 NOTE — H&P (View-Only) (Signed)
Patient ID: Laura Oneill, female   DOB: March 22, 1951, 69 y.o.   MRN: [id number]  Chief Complaint  Patient presents with   New Patient (Initial Visit)    abdominal hernia    HPI Laura Oneill is a 69 y.o. female.   She has been referred by her primary care provider for evaluation of an upper abdominal hernia.  She has not had surgery in this area previously.  She states that the hernia has been married since she was quite young, but now it is enlarging.  She notices it most when she lifts anything heavy.  She does wear an abdominal binder for comfort and support.  She says that she can push it in and she feels like air is coming through it.  Very rarely, she experiences some localized tenderness.  She denies any nausea or vomiting.  No symptoms of incarceration or obstruction.  She states that she would like to get back to exercise regimen and is concerned that the hernia may inhibit her efforts.   Past Medical History:  Diagnosis Date   Hyperlipidemia    Macular degeneration, age related    Osteopenia 11/04/2017   July 2019    Past Surgical History:  Procedure Laterality Date   COLONOSCOPY WITH PROPOFOL N/A 09/15/2017   Procedure: COLONOSCOPY WITH PROPOFOL;  Surgeon: Jonathon Bellows, MD;  Location: [facility] ENDOSCOPY;  Service: Gastroenterology;  Laterality: N/A;    Family History  Problem Relation Age of Onset   Hypothyroidism Mother    Dementia Mother    COPD Father    Breast cancer Neg Hx     Social History Social History   Tobacco Use   Smoking status: Never Smoker   Smokeless tobacco: Never Used  Vaping Use   Vaping Use: Never used  Substance Use Topics   Alcohol use: Yes    Alcohol/week: 0.0 standard drinks    Comment: occasional   Drug use: No    Allergies  Allergen Reactions   Latex Rash    Current Outpatient Medications  Medication Sig Dispense Refill   Calcium Carb-Cholecalciferol (CALCIUM/VITAMIN D) 500-200 MG-UNIT TABS Take 1 capsule by  mouth daily.     meloxicam (MOBIC) 7.5 MG tablet Take 1 tablet (7.5 mg total) by mouth daily as needed for pain. 30 tablet 3   Misc Natural Products (ADVANCED JOINT RELIEF) CAPS Take by mouth.     Multiple Vitamin (MULTIVITAMIN) tablet Take 1 tablet by mouth daily.     Multiple Vitamins-Minerals (PRESERVISION AREDS 2 PO) Take by mouth.     Omega-3 Fatty Acids (FISH OIL) 1000 MG CAPS Take 1 capsule by mouth daily.     No current facility-administered medications for this visit.    Review of Systems Review of Systems  All other systems reviewed and are negative.   Blood pressure (!) 124/91, pulse 71, temperature (!) 94.6 F (34.8 C), temperature source Oral, resp. rate 12, height 5\' 2"  (1.575 m), weight 177 lb 6.4 oz (80.5 kg), SpO2 95 %. Body mass index is 32.45 kg/m.  Physical Exam Physical Exam Constitutional:      General: She is not in acute distress.    Appearance: She is obese.  HENT:     Head: Normocephalic and atraumatic.     Ears:     Comments: Accessory auricular appendage on the right.    Nose:     Comments: Covered with a mask secondary to COVID-19 precautions    Mouth/Throat:     Comments: Covered  with a mask secondary to 19 precautions Eyes:     General: No scleral icterus.       Right eye: No discharge.        Left eye: No discharge.     Conjunctiva/sclera: Conjunctivae normal.  Neck:     Comments: No palpable cervical or supraclavicular lymphadenopathy.  The trachea is midline.  There are no dominant thyroid masses or palpable thyromegaly appreciated. Cardiovascular:     Rate and Rhythm: Normal rate and regular rhythm.  Pulmonary:     Effort: Pulmonary effort is normal.     Breath sounds: Normal breath sounds.  Abdominal:     General: Bowel sounds are normal.     Palpations: Abdomen is soft.     Hernia: A hernia is present.       Comments: There is an approximately 4 cm defect palpated in the epigastrium.  It does feel as though there may be some  bowel involvement but it reduces easily.  Genitourinary:    Comments: Deferred Musculoskeletal:        General: No swelling or tenderness.     Comments: Bilateral bunions  Skin:    General: Skin is warm and dry.  Neurological:     General: No focal deficit present.     Mental Status: She is alert and oriented to person, place, and time.  Psychiatric:        Mood and Affect: Mood normal.        Behavior: Behavior normal.     Data Reviewed I reviewed the PCP note from September 18, 2019.  The note is actually incomplete but does integrate the patient's desire to increase her level of activity as well as the question of her epigastric hernia.  This note also adds some additional diagnoses to the patient's medical history including a torus fracture at the distal end of the right fibula, serrated colon polyp, CKD stage II.  No relevant imaging has been performed.  Labs at that June 15th visit are essentially within normal limits, aside from some hypercholesterolemia.  Assessment This is a 69 year old woman who has a longstanding epigastric hernia.  It is reducible and she has never had any symptoms of incarceration or strangulation.  She does endorse a desire to have it repaired in order to facilitate an increase in her physical activity.  Plan I have recommended that she undergo hernia repair.  I discussed with her that this would involve placement of mesh as a reinforcement.  I discussed the risks of surgery with her.  These include, but are not limited to, bleeding, infection, risks of general anesthesia, potential injury to bowel or need to resect bowel, mesh complications, and hernia recurrence.  She would like to proceed with surgical intervention.  I will obtain a CT scan of the abdomen and pelvis to confirm no other sites of hernia and to get a better idea of the anatomy of the known hernia.  We will work on getting her scheduled for both the study and an operative date.    Fredirick Maudlin 09/25/2019, 9:38 AM

## 2019-09-25 NOTE — Telephone Encounter (Signed)
Pt has been advised of Pre-Admission date/time, COVID Testing date and Surgery date.  Surgery Date: 10/05/19 Preadmission Testing Date: 09/28/19 (phone 8a-1p) Covid Testing Date: 10/03/19 - patient advised to go to the [facility] ([place]) between 8a-1p  Patient has been made aware to call [phone], between 1-3:00pm the day before surgery, to find out what time to arrive for surgery.

## 2019-09-25 NOTE — Progress Notes (Signed)
Patient ID: Laura Oneill, female   DOB: 19-Oct-1950, 69 y.o.   MRN: [id number]  Chief Complaint  Patient presents with  . New Patient (Initial Visit)    abdominal hernia    HPI Laura Oneill is a 69 y.o. female.   She has been referred by her primary care provider for evaluation of an upper abdominal hernia.  She has not had surgery in this area previously.  She states that the hernia has been married since she was quite young, but now it is enlarging.  She notices it most when she lifts anything heavy.  She does wear an abdominal binder for comfort and support.  She says that she can push it in and she feels like air is coming through it.  Very rarely, she experiences some localized tenderness.  She denies any nausea or vomiting.  No symptoms of incarceration or obstruction.  She states that she would like to get back to exercise regimen and is concerned that the hernia may inhibit her efforts.   Past Medical History:  Diagnosis Date  . Hyperlipidemia   . Macular degeneration, age related   . Osteopenia 11/04/2017   July 2019    Past Surgical History:  Procedure Laterality Date  . COLONOSCOPY WITH PROPOFOL N/A 09/15/2017   Procedure: COLONOSCOPY WITH PROPOFOL;  Surgeon: Jonathon Bellows, MD;  Location: [facility] ENDOSCOPY;  Service: Gastroenterology;  Laterality: N/A;    Family History  Problem Relation Age of Onset  . Hypothyroidism Mother   . Dementia Mother   . COPD Father   . Breast cancer Neg Hx     Social History Social History   Tobacco Use  . Smoking status: Never Smoker  . Smokeless tobacco: Never Used  Vaping Use  . Vaping Use: Never used  Substance Use Topics  . Alcohol use: Yes    Alcohol/week: 0.0 standard drinks    Comment: occasional  . Drug use: No    Allergies  Allergen Reactions  . Latex Rash    Current Outpatient Medications  Medication Sig Dispense Refill  . Calcium Carb-Cholecalciferol (CALCIUM/VITAMIN D) 500-200 MG-UNIT TABS Take 1 capsule by  mouth daily.    . meloxicam (MOBIC) 7.5 MG tablet Take 1 tablet (7.5 mg total) by mouth daily as needed for pain. 30 tablet 3  . Misc Natural Products (ADVANCED JOINT RELIEF) CAPS Take by mouth.    . Multiple Vitamin (MULTIVITAMIN) tablet Take 1 tablet by mouth daily.    . Multiple Vitamins-Minerals (PRESERVISION AREDS 2 PO) Take by mouth.    . Omega-3 Fatty Acids (FISH OIL) 1000 MG CAPS Take 1 capsule by mouth daily.     No current facility-administered medications for this visit.    Review of Systems Review of Systems  All other systems reviewed and are negative.   Blood pressure (!) 124/91, pulse 71, temperature (!) 94.6 F (34.8 C), temperature source Oral, resp. rate 12, height 5\' 2"  (1.575 m), weight 177 lb 6.4 oz (80.5 kg), SpO2 95 %. Body mass index is 32.45 kg/m.  Physical Exam Physical Exam Constitutional:      General: She is not in acute distress.    Appearance: She is obese.  HENT:     Head: Normocephalic and atraumatic.     Ears:     Comments: Accessory auricular appendage on the right.    Nose:     Comments: Covered with a mask secondary to COVID-19 precautions    Mouth/Throat:     Comments: Covered  with a mask secondary to 19 precautions Eyes:     General: No scleral icterus.       Right eye: No discharge.        Left eye: No discharge.     Conjunctiva/sclera: Conjunctivae normal.  Neck:     Comments: No palpable cervical or supraclavicular lymphadenopathy.  The trachea is midline.  There are no dominant thyroid masses or palpable thyromegaly appreciated. Cardiovascular:     Rate and Rhythm: Normal rate and regular rhythm.  Pulmonary:     Effort: Pulmonary effort is normal.     Breath sounds: Normal breath sounds.  Abdominal:     General: Bowel sounds are normal.     Palpations: Abdomen is soft.     Hernia: A hernia is present.       Comments: There is an approximately 4 cm defect palpated in the epigastrium.  It does feel as though there may be some  bowel involvement but it reduces easily.  Genitourinary:    Comments: Deferred Musculoskeletal:        General: No swelling or tenderness.     Comments: Bilateral bunions  Skin:    General: Skin is warm and dry.  Neurological:     General: No focal deficit present.     Mental Status: She is alert and oriented to person, place, and time.  Psychiatric:        Mood and Affect: Mood normal.        Behavior: Behavior normal.     Data Reviewed I reviewed the PCP note from September 18, 2019.  The note is actually incomplete but does integrate the patient's desire to increase her level of activity as well as the question of her epigastric hernia.  This note also adds some additional diagnoses to the patient's medical history including a torus fracture at the distal end of the right fibula, serrated colon polyp, CKD stage II.  No relevant imaging has been performed.  Labs at that June 15th visit are essentially within normal limits, aside from some hypercholesterolemia.  Assessment This is a 69 year old woman who has a longstanding epigastric hernia.  It is reducible and she has never had any symptoms of incarceration or strangulation.  She does endorse a desire to have it repaired in order to facilitate an increase in her physical activity.  Plan I have recommended that she undergo hernia repair.  I discussed with her that this would involve placement of mesh as a reinforcement.  I discussed the risks of surgery with her.  These include, but are not limited to, bleeding, infection, risks of general anesthesia, potential injury to bowel or need to resect bowel, mesh complications, and hernia recurrence.  She would like to proceed with surgical intervention.  I will obtain a CT scan of the abdomen and pelvis to confirm no other sites of hernia and to get a better idea of the anatomy of the known hernia.  We will work on getting her scheduled for both the study and an operative date.    Fredirick Maudlin 09/25/2019, 9:38 AM

## 2019-09-25 NOTE — Patient Instructions (Addendum)
CT Abdomen /Pelvis scheduled @ 9:45 am . Nothing to drink/eat 4 hours prior. Please go today to pick up your prep kit today.   You will need to go to the Lab today @ [facility] for lab work.   Outpatient Imaging  [place] [address]  Our surgery scheduler will call you to schedule your surgery. Please have the Blue surgery sheet with you when speaking with her.       Hernia, Adult     A hernia is the bulging of an organ or tissue through a weak spot in the muscles of the abdomen (abdominal wall). Hernias develop most often near the belly button (navel) or the area where the leg meets the lower abdomen (groin). Common types of hernias include:  Incisional hernia. This type bulges through a scar from an abdominal surgery.  Umbilical hernia. This type develops near the navel.  Inguinal hernia. This type develops in the groin or scrotum.  Femoral hernia. This type develops under the groin, in the upper thigh area.  Hiatal hernia. This type occurs when part of the stomach slides above the muscle that separates the abdomen from the chest (diaphragm). What are the causes? This condition may be caused by:  Heavy lifting.  Coughing over a long period of time.  Straining to have a bowel movement. Constipation can lead to straining.  An incision made during an abdominal surgery.  A physical problem that is present at birth (congenital defect).  Being overweight or obese.  Smoking.  Excess fluid in the abdomen.  Undescended testicles in males. What are the signs or symptoms? The main symptom is a skin-colored, rounded bulge in the area of the hernia. However, a bulge may not always be present. It may grow bigger or be more visible when you cough or strain (such as when lifting something heavy). A hernia that can be pushed back into the area (is reducible) rarely causes pain. A hernia that cannot be pushed back into the area (is incarcerated) may lose its  blood supply (become strangulated). A hernia that is incarcerated may cause:  Pain.  Fever.  Nausea and vomiting.  Swelling.  Constipation. How is this diagnosed? A hernia may be diagnosed based on:  Your symptoms and medical history.  A physical exam. Your health care provider may ask you to cough or move in certain ways to see if the hernia becomes visible.  Imaging tests, such as: ? X-rays. ? Ultrasound. ? CT scan. How is this treated? A hernia that is small and painless may not need to be treated. A hernia that is large or painful may be treated with surgery. Inguinal hernias may be treated with surgery to prevent incarceration or strangulation. Strangulated hernias are always treated with surgery because a lack of blood supply to the trapped organ or tissue can cause it to die. Surgery to treat a hernia involves pushing the bulge back into place and repairing the weak area of the muscle or abdominal wall. Follow these instructions at home: Activity  Avoid straining.  Do not lift anything that is heavier than 10 lb (4.5 kg), or the limit that you are told, until your health care provider says that it is safe.  When lifting heavy objects, lift with your leg muscles, not your back muscles. Preventing constipation  Take actions to prevent constipation. Constipation leads to straining with bowel movements, which can make a hernia worse or cause a hernia repair to break down. Your health  care provider may recommend that you: ? Drink enough fluid to keep your urine pale yellow. ? Eat foods that are high in fiber, such as fresh fruits and vegetables, whole grains, and beans. ? Limit foods that are high in fat and processed sugars, such as fried or sweet foods. ? Take an over-the-counter or prescription medicine for constipation. General instructions  When coughing, try to cough gently.  You may try to push the hernia back in place by very gently pressing on it while lying  down. Do not try to force the bulge back in if it will not push in easily.  If you are overweight, work with your health care provider to lose weight safely.  Do not use any products that contain nicotine or tobacco, such as cigarettes and e-cigarettes. If you need help quitting, ask your health care provider.  If you are scheduled for hernia repair, watch your hernia for any changes in shape, size, or color. Tell your health care provider about any changes or new symptoms.  Take over-the-counter and prescription medicines only as told by your health care provider.  Keep all follow-up visits as told by your health care provider. This is important. Contact a health care provider if:  You develop new pain, swelling, or redness around your hernia.  You have signs of constipation, such as: ? Fewer bowel movements in a week than normal. ? Difficulty having a bowel movement. ? Stools that are dry, hard, or larger than normal. Get help right away if:  You have a fever.  You have abdomen pain that gets worse.  You feel nauseous or you vomit.  You cannot push the hernia back in place by very gently pressing on it while lying down. Do not try to force the bulge back in if it will not push in easily.  The hernia: ? Changes in shape, size, or color. ? Feels hard or tender. These symptoms may represent a serious problem that is an emergency. Do not wait to see if the symptoms will go away. Get medical help right away. Call your local emergency services (911 in the [place] Summary  A hernia is the bulging of an organ or tissue through a weak spot in the muscles of the abdomen (abdominal wall).  The main symptom is a skin-colored, rounded lump (bulge) in the hernia area. However, a bulge may not always be present. It may grow bigger or more visible when you cough or strain (such as when having a bowel movement).  A hernia that is small and painless may not need to be treated. A hernia that is  large or painful may be treated with surgery.  Surgery to treat a hernia involves pushing the bulge back into place and repairing the weak part of the abdomen. This information is not intended to replace advice given to you by your health care provider. Make sure you discuss any questions you have with your health care provider. Document Revised: 07/13/2018 Document Reviewed: 12/22/2016 Elsevier Patient Education  [place].

## 2019-09-27 ENCOUNTER — Other Ambulatory Visit: Payer: Self-pay | Admitting: General Surgery

## 2019-09-27 DIAGNOSIS — K439 Ventral hernia without obstruction or gangrene: Secondary | ICD-10-CM

## 2019-09-28 ENCOUNTER — Other Ambulatory Visit: Payer: Self-pay

## 2019-09-28 ENCOUNTER — Other Ambulatory Visit
Admission: RE | Admit: 2019-09-28 | Discharge: 2019-09-28 | Disposition: A | Discharge status: Home / Self Care | Payer: Medicare HMO | Source: Ambulatory Visit | Attending: General Surgery | Admitting: General Surgery

## 2019-09-28 NOTE — Patient Instructions (Signed)
Your procedure is scheduled on: Friday October 05, 2019 Report to Day Surgery inside [facility] 2nd Floor. To find out your arrival time please call [phone] between 1PM - 3PM on Thursday October 04, 2019.  Remember: Instructions that are not followed completely may result in serious medical risk,  up to and including death, or upon the discretion of your surgeon and anesthesiologist your  surgery may need to be rescheduled.     _X__ 1. Do not eat food after midnight the night before your procedure.                 No gum chewing or hard candies. You may drink clear liquids up to 2 hours                 before you are scheduled to arrive for your surgery- DO not drink clear                 liquids within 2 hours of the start of your surgery.                 Clear Liquids include:  water, apple juice without pulp, clear Gatorade, G2 or                  Gatorade Zero (avoid Red/Purple/Blue), Black Coffee or Tea (Do not add                 anything to coffee or tea).  __X__2.  On the morning of surgery brush your teeth with toothpaste and water, you                may rinse your mouth with mouthwash if you wish.  Do not swallow any toothpaste of mouthwash.     _X__ 3.  No Alcohol for 24 hours before or after surgery.   _X__ 4.  Do Not Smoke or use e-cigarettes For 24 Hours Prior to Your Surgery.                 Do not use any chewable tobacco products for at least 6 hours prior to                 Surgery.  _X__  5.  Do not use any recreational drugs (marijuana, cocaine, heroin, ecstacy, MDMA or other)                For at least one week prior to your surgery.  Combination of these drugs with anesthesia                May have life threatening results.  __X__ 6. Notify your doctor if there is any change in your medical condition      (cold, fever, infections).     Do not wear jewelry, make-up, hairpins, clips or nail polish. Do not wear lotions, powders, or  perfumes. You may wear deodorant. Do not shave 48 hours prior to surgery. Men may shave face and neck. Do not bring valuables to the hospital.    [facility] is not responsible for any belongings or valuables.  Contacts, dentures or bridgework may not be worn into surgery. Leave your suitcase in the car. After surgery it may be brought to your room. For patients admitted to the hospital, discharge time is determined by your treatment team.   Patients discharged the day of surgery will not be allowed to drive home.   Make arrangements for someone to be with  you for the first 24 hours of your Same Day Discharge.   ____ Take these medicines the morning of surgery with A SIP OF WATER:    1. None   __X__ Use CHG Soap as directed  __X__ Stop Anti-inflammatories such as meloxicam (MOBIC), ibuprofen, Aleve, Advil, aspirin and or BC powders.    __X__ Stop supplements until after surgery.    __X__ Do not start any herbal supplement before your surgery.

## 2019-10-01 ENCOUNTER — Encounter
Admission: RE | Admit: 2019-10-01 | Discharge: 2019-10-01 | Disposition: A | Discharge status: Home / Self Care | Payer: Medicare HMO | Source: Ambulatory Visit | Attending: General Surgery | Admitting: General Surgery

## 2019-10-01 ENCOUNTER — Other Ambulatory Visit: Payer: Self-pay

## 2019-10-01 DIAGNOSIS — Z01818 Encounter for other preprocedural examination: Secondary | ICD-10-CM | POA: Diagnosis not present

## 2019-10-01 DIAGNOSIS — R54 Age-related physical debility: Secondary | ICD-10-CM | POA: Diagnosis not present

## 2019-10-01 DIAGNOSIS — R9431 Abnormal electrocardiogram [ECG] [EKG]: Secondary | ICD-10-CM | POA: Insufficient documentation

## 2019-10-01 DIAGNOSIS — R001 Bradycardia, unspecified: Secondary | ICD-10-CM | POA: Diagnosis not present

## 2019-10-01 DIAGNOSIS — Z01810 Encounter for preprocedural cardiovascular examination: Secondary | ICD-10-CM | POA: Diagnosis not present

## 2019-10-03 ENCOUNTER — Other Ambulatory Visit: Payer: Self-pay

## 2019-10-03 ENCOUNTER — Ambulatory Visit
Admission: RE | Admit: 2019-10-03 | Discharge: 2019-10-03 | Disposition: A | Discharge status: Home / Self Care | Payer: Medicare HMO | Source: Ambulatory Visit | Attending: General Surgery | Admitting: General Surgery

## 2019-10-03 ENCOUNTER — Other Ambulatory Visit
Admission: RE | Admit: 2019-10-03 | Discharge: 2019-10-03 | Disposition: A | Discharge status: Home / Self Care | Payer: Medicare HMO | Source: Ambulatory Visit | Attending: General Surgery | Admitting: General Surgery

## 2019-10-03 DIAGNOSIS — Z20822 Contact with and (suspected) exposure to covid-19: Secondary | ICD-10-CM | POA: Diagnosis not present

## 2019-10-03 DIAGNOSIS — D1803 Hemangioma of intra-abdominal structures: Secondary | ICD-10-CM | POA: Diagnosis not present

## 2019-10-03 DIAGNOSIS — R1013 Epigastric pain: Secondary | ICD-10-CM | POA: Insufficient documentation

## 2019-10-03 DIAGNOSIS — K439 Ventral hernia without obstruction or gangrene: Secondary | ICD-10-CM | POA: Diagnosis not present

## 2019-10-03 DIAGNOSIS — K7689 Other specified diseases of liver: Secondary | ICD-10-CM | POA: Diagnosis not present

## 2019-10-03 LAB — SARS CORONAVIRUS 2 (TAT 6-24 HRS): SARS Coronavirus 2: NEGATIVE

## 2019-10-03 MED ORDER — IOHEXOL 300 MG/ML  SOLN
100.0000 mL | Freq: Once | INTRAMUSCULAR | Status: AC | PRN
  Administered 2019-10-03: 100 mL via INTRAVENOUS

## 2019-10-05 ENCOUNTER — Encounter: Payer: Self-pay | Admitting: Family Medicine

## 2019-10-05 ENCOUNTER — Encounter: Payer: Self-pay | Admitting: General Surgery

## 2019-10-05 ENCOUNTER — Ambulatory Visit: Payer: Medicare HMO | Admitting: Anesthesiology

## 2019-10-05 ENCOUNTER — Other Ambulatory Visit: Payer: Self-pay

## 2019-10-05 ENCOUNTER — Encounter
Admission: RE | Disposition: A | Discharge status: Home / Self Care | Payer: Self-pay | Source: Home / Self Care | Attending: General Surgery

## 2019-10-05 ENCOUNTER — Ambulatory Visit
Admission: RE | Admit: 2019-10-05 | Discharge: 2019-10-05 | Disposition: A | Discharge status: Home / Self Care | Payer: Medicare HMO | Attending: General Surgery | Admitting: General Surgery

## 2019-10-05 DIAGNOSIS — Z791 Long term (current) use of non-steroidal anti-inflammatories (NSAID): Secondary | ICD-10-CM | POA: Insufficient documentation

## 2019-10-05 DIAGNOSIS — E785 Hyperlipidemia, unspecified: Secondary | ICD-10-CM | POA: Insufficient documentation

## 2019-10-05 DIAGNOSIS — K439 Ventral hernia without obstruction or gangrene: Secondary | ICD-10-CM

## 2019-10-05 DIAGNOSIS — M8580 Other specified disorders of bone density and structure, unspecified site: Secondary | ICD-10-CM | POA: Diagnosis not present

## 2019-10-05 DIAGNOSIS — R1013 Epigastric pain: Secondary | ICD-10-CM | POA: Diagnosis not present

## 2019-10-05 DIAGNOSIS — N183 Chronic kidney disease, stage 3 unspecified: Secondary | ICD-10-CM | POA: Insufficient documentation

## 2019-10-05 DIAGNOSIS — E7800 Pure hypercholesterolemia, unspecified: Secondary | ICD-10-CM | POA: Diagnosis not present

## 2019-10-05 HISTORY — PX: EPIGASTRIC HERNIA REPAIR: SHX404

## 2019-10-05 SURGERY — REPAIR, HERNIA, EPIGASTRIC, ADULT
Anesthesia: General

## 2019-10-05 MED ORDER — CEFAZOLIN SODIUM-DEXTROSE 2-4 GM/100ML-% IV SOLN
2.0000 g | INTRAVENOUS | Status: AC
  Administered 2019-10-05: 2 g via INTRAVENOUS

## 2019-10-05 MED ORDER — ACETAMINOPHEN 500 MG PO TABS: 1000.0000 mg | ORAL_TABLET | Freq: Four times a day (QID) | ORAL | 0 refills | Status: AC | PRN

## 2019-10-05 MED ORDER — GABAPENTIN 300 MG PO CAPS: 300.0000 mg | ORAL_CAPSULE | ORAL | Status: AC

## 2019-10-05 MED ORDER — LIDOCAINE HCL (PF) 2 % IJ SOLN
INTRAMUSCULAR | Status: AC
  Filled 2019-10-05: qty 5

## 2019-10-05 MED ORDER — FAMOTIDINE 20 MG PO TABS: 20.0000 mg | ORAL_TABLET | Freq: Once | ORAL | Status: AC

## 2019-10-05 MED ORDER — CHLORHEXIDINE GLUCONATE 0.12 % MT SOLN
OROMUCOSAL | Status: AC
  Administered 2019-10-05: 15 mL via OROMUCOSAL
  Filled 2019-10-05: qty 15

## 2019-10-05 MED ORDER — OXYCODONE HCL 5 MG PO TABS
5.0000 mg | ORAL_TABLET | Freq: Four times a day (QID) | ORAL | 0 refills | Status: DC | PRN
Start: 2019-10-05 — End: 2019-10-23

## 2019-10-05 MED ORDER — EPHEDRINE SULFATE 50 MG/ML IJ SOLN
INTRAMUSCULAR | Status: DC | PRN
  Administered 2019-10-05 (×2): 10 mg via INTRAVENOUS

## 2019-10-05 MED ORDER — GABAPENTIN 300 MG PO CAPS
ORAL_CAPSULE | ORAL | Status: AC
  Administered 2019-10-05: 300 mg via ORAL
  Filled 2019-10-05: qty 1

## 2019-10-05 MED ORDER — ONDANSETRON HCL 4 MG/2ML IJ SOLN: 4.0000 mg | Freq: Once | INTRAMUSCULAR | Status: DC | PRN

## 2019-10-05 MED ORDER — PROPOFOL 10 MG/ML IV BOLUS
INTRAVENOUS | Status: AC
  Filled 2019-10-05: qty 20

## 2019-10-05 MED ORDER — FENTANYL CITRATE (PF) 100 MCG/2ML IJ SOLN: 25.0000 ug | INTRAMUSCULAR | Status: DC | PRN

## 2019-10-05 MED ORDER — LIDOCAINE-EPINEPHRINE 1 %-1:100000 IJ SOLN
INTRAMUSCULAR | Status: DC | PRN
  Administered 2019-10-05: 18 mL via INTRAMUSCULAR

## 2019-10-05 MED ORDER — BUPIVACAINE LIPOSOME 1.3 % IJ SUSP
INTRAMUSCULAR | Status: AC
  Filled 2019-10-05: qty 20

## 2019-10-05 MED ORDER — DEXAMETHASONE SODIUM PHOSPHATE 10 MG/ML IJ SOLN
INTRAMUSCULAR | Status: DC | PRN
  Administered 2019-10-05: 10 mg via INTRAVENOUS

## 2019-10-05 MED ORDER — ONDANSETRON HCL 4 MG/2ML IJ SOLN
INTRAMUSCULAR | Status: DC | PRN
  Administered 2019-10-05: 4 mg via INTRAVENOUS

## 2019-10-05 MED ORDER — MIDAZOLAM HCL 2 MG/2ML IJ SOLN
INTRAMUSCULAR | Status: DC | PRN
  Administered 2019-10-05: 2 mg via INTRAVENOUS

## 2019-10-05 MED ORDER — CHLORHEXIDINE GLUCONATE CLOTH 2 % EX PADS
6.0000 | MEDICATED_PAD | Freq: Once | CUTANEOUS | Status: AC
  Administered 2019-10-05: 6 via TOPICAL

## 2019-10-05 MED ORDER — FENTANYL CITRATE (PF) 100 MCG/2ML IJ SOLN
INTRAMUSCULAR | Status: AC
  Filled 2019-10-05: qty 2

## 2019-10-05 MED ORDER — BUPIVACAINE LIPOSOME 1.3 % IJ SUSP
INTRAMUSCULAR | Status: DC | PRN
  Administered 2019-10-05: 20 mL

## 2019-10-05 MED ORDER — LACTATED RINGERS IV SOLN: INTRAVENOUS | Status: DC

## 2019-10-05 MED ORDER — SUGAMMADEX SODIUM 200 MG/2ML IV SOLN
INTRAVENOUS | Status: DC | PRN
  Administered 2019-10-05: 200 mg via INTRAVENOUS

## 2019-10-05 MED ORDER — ROCURONIUM BROMIDE 100 MG/10ML IV SOLN
INTRAVENOUS | Status: DC | PRN
  Administered 2019-10-05: 40 mg via INTRAVENOUS

## 2019-10-05 MED ORDER — BUPIVACAINE HCL (PF) 0.25 % IJ SOLN
INTRAMUSCULAR | Status: AC
  Filled 2019-10-05: qty 30

## 2019-10-05 MED ORDER — CHLORHEXIDINE GLUCONATE 0.12 % MT SOLN: 15.0000 mL | Freq: Once | OROMUCOSAL | Status: AC

## 2019-10-05 MED ORDER — ACETAMINOPHEN 500 MG PO TABS
ORAL_TABLET | ORAL | Status: AC
  Administered 2019-10-05: 1000 mg via ORAL
  Filled 2019-10-05: qty 2

## 2019-10-05 MED ORDER — PROPOFOL 10 MG/ML IV BOLUS
INTRAVENOUS | Status: DC | PRN
  Administered 2019-10-05: 160 mg via INTRAVENOUS

## 2019-10-05 MED ORDER — FENTANYL CITRATE (PF) 100 MCG/2ML IJ SOLN
INTRAMUSCULAR | Status: DC | PRN
  Administered 2019-10-05: 50 ug via INTRAVENOUS

## 2019-10-05 MED ORDER — BUPIVACAINE LIPOSOME 1.3 % IJ SUSP: 20.0000 mL | Freq: Once | INTRAMUSCULAR | Status: DC

## 2019-10-05 MED ORDER — LIDOCAINE-EPINEPHRINE 1 %-1:100000 IJ SOLN
INTRAMUSCULAR | Status: AC
  Filled 2019-10-05: qty 1

## 2019-10-05 MED ORDER — FAMOTIDINE 20 MG PO TABS
ORAL_TABLET | ORAL | Status: AC
  Administered 2019-10-05: 20 mg via ORAL
  Filled 2019-10-05: qty 1

## 2019-10-05 MED ORDER — MIDAZOLAM HCL 2 MG/2ML IJ SOLN
INTRAMUSCULAR | Status: AC
  Filled 2019-10-05: qty 2

## 2019-10-05 MED ORDER — ACETAMINOPHEN 500 MG PO TABS: 1000.0000 mg | ORAL_TABLET | ORAL | Status: AC

## 2019-10-05 MED ORDER — LIDOCAINE HCL (CARDIAC) PF 100 MG/5ML IV SOSY
PREFILLED_SYRINGE | INTRAVENOUS | Status: DC | PRN
  Administered 2019-10-05: 80 mg via INTRAVENOUS

## 2019-10-05 MED ORDER — CEFAZOLIN SODIUM-DEXTROSE 2-4 GM/100ML-% IV SOLN
INTRAVENOUS | Status: AC
  Filled 2019-10-05: qty 100

## 2019-10-05 MED ORDER — ORAL CARE MOUTH RINSE: 15.0000 mL | Freq: Once | OROMUCOSAL | Status: AC

## 2019-10-05 SURGICAL SUPPLY — 33 items
BLADE SURG 15 STRL LF DISP TIS (BLADE) ×1 IMPLANT
BLADE SURG 15 STRL SS (BLADE) ×1
CANISTER SUCT 1200ML W/VALVE (MISCELLANEOUS) ×2 IMPLANT
CHLORAPREP W/TINT 26 (MISCELLANEOUS) ×2 IMPLANT
COVER WAND RF STERILE (DRAPES) ×2 IMPLANT
DERMABOND ADVANCED (GAUZE/BANDAGES/DRESSINGS) ×1
DERMABOND ADVANCED .7 DNX12 (GAUZE/BANDAGES/DRESSINGS) ×1 IMPLANT
DRAIN PENROSE 5/8X18 LTX STRL (WOUND CARE) ×2 IMPLANT
DRAPE LAPAROTOMY 77X122 PED (DRAPES) ×2 IMPLANT
ELECT CAUTERY BLADE TIP 2.5 (TIP) ×2
ELECT REM PT RETURN 9FT ADLT (ELECTROSURGICAL) ×2
ELECTRODE CAUTERY BLDE TIP 2.5 (TIP) ×1 IMPLANT
ELECTRODE REM PT RTRN 9FT ADLT (ELECTROSURGICAL) ×1 IMPLANT
GLOVE BIO SURGEON STRL SZ 6.5 (GLOVE) ×2 IMPLANT
GLOVE INDICATOR 7.0 STRL GRN (GLOVE) ×4 IMPLANT
GOWN STRL REUS W/ TWL LRG LVL3 (GOWN DISPOSABLE) ×2 IMPLANT
GOWN STRL REUS W/TWL LRG LVL3 (GOWN DISPOSABLE) ×2
KIT TURNOVER KIT A (KITS) ×2 IMPLANT
LABEL OR SOLS (LABEL) ×2 IMPLANT
MESH VENTRALEX ST 2.5 CRC MED (Mesh General) ×2 IMPLANT
NEEDLE HYPO 22GX1.5 SAFETY (NEEDLE) ×2 IMPLANT
NS IRRIG 500ML POUR BTL (IV SOLUTION) ×2 IMPLANT
PACK BASIN MINOR (MISCELLANEOUS) ×2 IMPLANT
STRIP CLOSURE SKIN 1/2X4 (GAUZE/BANDAGES/DRESSINGS) ×2 IMPLANT
SUT ETHIBOND NAB MO 7 #0 18IN (SUTURE) ×4 IMPLANT
SUT MNCRL 4-0 (SUTURE) ×1
SUT MNCRL 4-0 27XMFL (SUTURE) ×1
SUT VIC AB 3-0 SH 27 (SUTURE) ×1
SUT VIC AB 3-0 SH 27X BRD (SUTURE) ×1 IMPLANT
SUT VICRYL 2-0 SH 8X27 (SUTURE) ×2 IMPLANT
SUTURE MNCRL 4-0 27XMF (SUTURE) ×1 IMPLANT
SYR 10ML LL (SYRINGE) ×2 IMPLANT
SYR BULB IRRIG 60ML STRL (SYRINGE) ×2 IMPLANT

## 2019-10-05 NOTE — Transfer of Care (Signed)
Immediate Anesthesia Transfer of Care Note  Patient: SUMIKO CEASAR  Procedure(s) Performed: HERNIA REPAIR EPIGASTRIC ADULT, open (N/A )  Patient Location: PACU  Anesthesia Type:General  Level of Consciousness: awake and alert   Airway & Oxygen Therapy: Patient Spontanous Breathing and Patient connected to face mask oxygen  Post-op Assessment: Report given to RN and Post -op Vital signs reviewed and stable  Post vital signs: Reviewed and stable  Last Vitals:  Vitals Value Taken Time  BP 98/67 10/05/19 0910  Temp    Pulse 66 10/05/19 0910  Resp 14 10/05/19 0910  SpO2 100 % 10/05/19 0910  Vitals shown include unvalidated device data.  Last Pain:  Vitals:   10/05/19 0624  TempSrc: Tympanic  PainSc: 0-No pain         Complications: No complications documented.

## 2019-10-05 NOTE — Op Note (Signed)
Operative Note  Preoperative Diagnosis:  Epigatric/ventral hernia  Postoperative Diagnosis:  Same, reducible  Operation:  Open repair of epigastric/ventral hernia with mesh  Surgeon: Fredirick Maudlin, MD  Assistant: None   Anesthesia: GETA   Findings: 4 cm fascial defect with omental fat within hernia  Indications: symptomatic epigastric hernia  Procedure In Detail: The patient was identified in the preoperative holding area.  She is brought to the operating room and placed supine on the OR table.  All bony prominences were padded and bilateral sequential compression devices were placed on the lower extremities.  General endotracheal anesthesia was induced without incident.  She was positioned appropriately for the operation and sterilely prepped and draped in standard fashion.  A timeout is performed confirming the patient's identity, the procedure being performed, her allergies, all necessary equipment was available, and that maintenance anesthesia was adequate.  Perioperative antibiotics were administered.  The skin and subcutaneous tissues overlying the hernia were infiltrated with a one-to-one mixture of 0.25% bupivacaine and 1% lidocaine with epinephrine.  A vertical incision was made and carried down through the subcutaneous tissues using electrocautery.  The hernia sac was identified.  It was carefully dissected down to the fascia and excised.  The omental fat within the sac was reduced back into the abdomen.  The fascia was then cleared circumferentially to allow good mesh positioning.  A 6 cm Ventralex mesh was then secured to the fascia with 0 Ethibond sutures.  It was parachuted into the defect and approximated to the abdominal wall.  The sutures were tied and the tails of the Ventralex were cut.  The hernia defect was then closed transversely using interrupted 0 Ethibond sutures, taking a small bite of the mesh with each pass.  Liposomal bupivacaine was then infiltrated along the  fascial planes.  The wound was then closed in 2 layers with a deep dermal 3-0 Vicryl and a running subcuticular 4-0 Monocryl.  The skin was cleaned.  Dermabond and Steri-Strips were applied.  The patient was awakened, extubated, and taken to the postanesthesia care unit in good condition.  EBL: Less than 2 cc  IVF: See anesthesia record  Specimen(s): None  Complications: none immediately apparent.   Counts: all needles, instruments, and sponges were counted and reported to be correct in number at the end of the case.   I was present for and participated in the entire operation.  Fredirick Maudlin 9:25 AM

## 2019-10-05 NOTE — Interval H&P Note (Signed)
History and Physical Interval Note:  10/05/2019 7:23 AM  Laura Oneill  has presented today for surgery, with the diagnosis of Epigastric hernia.  The various methods of treatment have been discussed with the patient and family. After consideration of risks, benefits and other options for treatment, the patient has consented to  Procedure(s): HERNIA REPAIR EPIGASTRIC ADULT, open (N/A) as a surgical intervention.  The patient's history has been reviewed, patient examined, no change in status, stable for surgery.  I have reviewed the patient's chart and labs.  Questions were answered to the patient's satisfaction.     Fredirick Maudlin

## 2019-10-05 NOTE — Anesthesia Procedure Notes (Signed)
Procedure Name: Intubation Date/Time: 10/05/2019 7:50 AM Performed by: Allean Found, CRNA Pre-anesthesia Checklist: Patient identified, Patient being monitored, Timeout performed, Emergency Drugs available and Suction available Patient Re-evaluated:Patient Re-evaluated prior to induction Oxygen Delivery Method: Circle system utilized Preoxygenation: Pre-oxygenation with 100% oxygen Induction Type: IV induction Ventilation: Mask ventilation without difficulty Laryngoscope Size: 3 and McGraph Grade View: Grade II Tube type: Oral Tube size: 7.0 mm Number of attempts: 1 Airway Equipment and Method: Stylet Placement Confirmation: ETT inserted through vocal cords under direct vision,  positive ETCO2 and breath sounds checked- equal and bilateral Secured at: 21 cm Tube secured with: Tape Dental Injury: Teeth and Oropharynx as per pre-operative assessment  Difficulty Due To: Difficult Airway- due to anterior larynx

## 2019-10-05 NOTE — Discharge Instructions (Signed)
AMBULATORY SURGERY  DISCHARGE INSTRUCTIONS   1) The drugs that you were given will stay in your system until tomorrow so for the next 24 hours you should not:  A) Drive an automobile B) Make any legal decisions C) Drink any alcoholic beverage   2) You may resume regular meals tomorrow.  Today it is better to start with liquids and gradually work up to solid foods.  You may eat anything you prefer, but it is better to start with liquids, then soup and crackers, and gradually work up to solid foods.   3) Please notify your doctor immediately if you have any unusual bleeding, trouble breathing, redness and pain at the surgery site, drainage, fever, or pain not relieved by medication.    4) Additional Instructions:        Please contact your physician with any problems or Same Day Surgery at [phone], Monday through Friday 6 am to 4 pm, or [place] at [facility] number at [phone].AMBULATORY SURGERY  DISCHARGE INSTRUCTIONS   5) The drugs that you were given will stay in your system until tomorrow so for the next 24 hours you should not:  D) Drive an automobile E) Make any legal decisions F) Drink any alcoholic beverage   6) You may resume regular meals tomorrow.  Today it is better to start with liquids and gradually work up to solid foods.  You may eat anything you prefer, but it is better to start with liquids, then soup and crackers, and gradually work up to solid foods.   7) Please notify your doctor immediately if you have any unusual bleeding, trouble breathing, redness and pain at the surgery site, drainage, fever, or pain not relieved by medication.    8) Additional Instructions:        Please contact your physician with any problems or Same Day Surgery at [phone], Monday through Friday 6 am to 4 pm, or  at [facility] number at [phone].Open Hernia Repair, Adult, Care After This sheet gives you information about how to  care for yourself after your procedure. Your health care provider may also give you more specific instructions. If you have problems or questions, contact your health care provider. What can I expect after the procedure? After the procedure, it is common to have:  Mild discomfort.  Slight bruising.  Minor swelling.  Pain in the abdomen. Follow these instructions at home: Incision care   Follow instructions from your health care provider about how to take care of your incision area. Make sure you: ? Wash your hands with soap and water before you change your bandage (dressing). If soap and water are not available, use hand sanitizer. ? Change your dressing as told by your health care provider. ? Leave stitches (sutures), skin glue, or adhesive strips in place. These skin closures may need to stay in place for 2 weeks or longer. If adhesive strip edges start to loosen and curl up, you may trim the loose edges. Do not remove adhesive strips completely unless your health care provider tells you to do that.  Check your incision area every day for signs of infection. Check for: ? More redness, swelling, or pain. ? More fluid or blood. ? Warmth. ? Pus or a bad smell. Activity  Do not drive or use heavy machinery while taking prescription pain medicine. Do not drive until your health care provider approves.  Until your health care provider approves: ? Do not lift anything that is heavier  than 10 lb (4.5 kg). ? Do not play contact sports.  Return to your normal activities as told by your health care provider. Ask your health care provider what activities are safe. General instructions  To prevent or treat constipation while you are taking prescription pain medicine, your health care provider may recommend that you: ? Drink enough fluid to keep your urine clear or pale yellow. ? Take over-the-counter or prescription medicines. ? Eat foods that are high in fiber, such as fresh fruits and  vegetables, whole grains, and beans. ? Limit foods that are high in fat and processed sugars, such as fried and sweet foods.  Take over-the-counter and prescription medicines only as told by your health care provider.  Do not take tub baths or go swimming until your health care provider approves.  Keep all follow-up visits as told by your health care provider. This is important. Contact a health care provider if:  You develop a rash.  You have more redness, swelling, or pain around your incision.  You have more fluid or blood coming from your incision.  Your incision feels warm to the touch.  You have pus or a bad smell coming from your incision.  You have a fever or chills.  You have blood in your stool (feces).  You have not had a bowel movement in 2-3 days.  Your pain is not controlled with medicine. Get help right away if:  You have chest pain or shortness of breath.  You feel light-headed or feel faint.  You have severe pain.  You vomit and your pain is worse. This information is not intended to replace advice given to you by your health care provider. Make sure you discuss any questions you have with your health care provider. Document Revised: 03/04/2017 Document Reviewed: 09/03/2015 Elsevier Patient Education  2020 [employer].

## 2019-10-05 NOTE — Anesthesia Preprocedure Evaluation (Addendum)
Anesthesia Evaluation  Patient identified by MRN, date of birth, ID band Patient awake    Reviewed: Allergy & Precautions, H&P , NPO status , Patient's Chart, lab work & pertinent test results, reviewed documented beta blocker date and time   Airway Mallampati: II   Neck ROM: full    Dental  (+) Poor Dentition   Pulmonary neg pulmonary ROS,    Pulmonary exam normal        Cardiovascular negative cardio ROS Normal cardiovascular exam Rhythm:regular Rate:Normal     Neuro/Psych negative neurological ROS  negative psych ROS   GI/Hepatic negative GI ROS, Neg liver ROS,   Endo/Other  negative endocrine ROS  Renal/GU Renal diseasenegative Renal ROS  negative genitourinary   Musculoskeletal   Abdominal   Peds  Hematology negative hematology ROS (+)   Anesthesia Other Findings Past Medical History: No date: Hyperlipidemia History reviewed. No pertinent surgical history. BMI    Body Mass Index:  32.92 kg/m     Reproductive/Obstetrics negative OB ROS                             Anesthesia Physical  Anesthesia Plan  ASA: II  Anesthesia Plan: General   Post-op Pain Management:    Induction:   PONV Risk Score and Plan:   Airway Management Planned: Oral ETT  Additional Equipment:   Intra-op Plan:   Post-operative Plan: Extubation in OR  Informed Consent: I have reviewed the patients History and Physical, chart, labs and discussed the procedure including the risks, benefits and alternatives for the proposed anesthesia with the patient or authorized representative who has indicated his/her understanding and acceptance.     Dental Advisory Given  Plan Discussed with: CRNA  Anesthesia Plan Comments:         Anesthesia Quick Evaluation

## 2019-10-06 ENCOUNTER — Encounter: Payer: Self-pay | Admitting: General Surgery

## 2019-10-09 NOTE — Anesthesia Postprocedure Evaluation (Signed)
Anesthesia Post Note  Patient: Laura Oneill  Procedure(s) Performed: HERNIA REPAIR EPIGASTRIC ADULT, open (N/A )  Patient location during evaluation: PACU Anesthesia Type: General Level of consciousness: awake and alert and oriented Pain management: pain level controlled Vital Signs Assessment: post-procedure vital signs reviewed and stable Respiratory status: spontaneous breathing Cardiovascular status: blood pressure returned to baseline Anesthetic complications: no   No complications documented.   Last Vitals:  Vitals:   10/05/19 0925 10/05/19 0953  BP: 110/66 116/69  Pulse: 63 65  Resp: 14 16  Temp:  (!) 35.7 C  SpO2: 100% 97%    Last Pain:  Vitals:   10/05/19 0953  TempSrc: Tympanic  PainSc: 0-No pain                 Jailan Trimm

## 2019-10-23 ENCOUNTER — Encounter: Payer: Self-pay | Admitting: Physician Assistant

## 2019-10-23 ENCOUNTER — Other Ambulatory Visit: Payer: Self-pay

## 2019-10-23 ENCOUNTER — Ambulatory Visit (INDEPENDENT_AMBULATORY_CARE_PROVIDER_SITE_OTHER): Payer: Self-pay | Admitting: Physician Assistant

## 2019-10-23 VITALS — BP 117/70 | HR 58 | Temp 98.2°F | Ht 62.0 in | Wt 177.6 lb

## 2019-10-23 DIAGNOSIS — Z09 Encounter for follow-up examination after completed treatment for conditions other than malignant neoplasm: Secondary | ICD-10-CM

## 2019-10-23 DIAGNOSIS — K439 Ventral hernia without obstruction or gangrene: Secondary | ICD-10-CM

## 2019-10-23 NOTE — Progress Notes (Signed)
[facility] POST-OP OFFICE VISIT  10/23/2019  HPI: Laura Oneill is a 69 y.o. female 18 days s/p open repair of epigastric/ventral hernia with mesh with Dr Celine Ahr.   She is doing extremely well No abdominal pain, nausea, emesis, fever, chills Some itching at incision, otherwise no erythema or drainage reported Tolerating PO Mobilizing while following restrictions   Vital signs: BP 117/70    Pulse (!) 58    Temp 98.2 F (36.8 C)    Ht 5\' 2"  (1.575 m)    Wt 177 lb 9.6 oz (80.6 kg)    SpO2 96%    BMI 32.48 kg/m    Physical Exam: Constitutional: Well appearing female, NAD Abdomen: Soft, non-tender, non-distended, no rebound/guarding, no recurrence Skin: Incision to the epigastric region is well healed, no erythema or drainage, there is an expected level of induration present  Assessment/Plan: This is a 69 y.o. female 18 days s/p open repair of epigastric/ventral hernia with mesh   - Pain control prn with tylenol/motrin  - Reviewed wound care  - Reviewed lifting restrictions / activity recommendations  - no there issues  - rtc prn  -- Edison Simon, PA-C [facility] 10/23/2019, 10:01 AM [phone] M-F: 7am - 4pm

## 2019-10-23 NOTE — Patient Instructions (Addendum)
Follow-up with our office as needed.  Please call and ask to speak with a nurse if you develop questions or concerns.   GENERAL POST-OPERATIVE PATIENT INSTRUCTIONS   WOUND CARE INSTRUCTIONS:  Keep a dry clean dressing on the wound if there is drainage. The initial bandage may be removed after 24 hours.  Once the wound has quit draining you may leave it open to air.  If clothing rubs against the wound or causes irritation and the wound is not draining you may cover it with a dry dressing during the daytime.  Try to keep the wound dry and avoid ointments on the wound unless directed to do so.  If the wound becomes bright red and painful or starts to drain infected material that is not clear, please contact your physician immediately.  If the wound is mildly pink and has a thick firm ridge underneath it, this is normal, and is referred to as a healing ridge.  This will resolve over the next 4-6 weeks.  BATHING: You may shower if you have been informed of this by your surgeon. However, Please do not submerge in a tub, hot tub, or pool until incisions are completely sealed or have been told by your surgeon that you may do so.  DIET:  You may eat any foods that you can tolerate.  It is a good idea to eat a high fiber diet and take in plenty of fluids to prevent constipation.  If you do become constipated you may want to take a mild laxative or take ducolax tablets on a daily basis until your bowel habits are regular.  Constipation can be very uncomfortable, along with straining, after recent surgery.  ACTIVITY:  You are encouraged to cough and deep breath or use your incentive spirometer if you were given one, every 15-30 minutes when awake.  This will help prevent respiratory complications and low grade fevers post-operatively if you had a general anesthetic.  You may want to hug a pillow when coughing and sneezing to add additional support to the surgical area, if you had abdominal or chest surgery, which  will decrease pain during these times.  You are encouraged to walk and engage in light activity for the next two weeks.  You should not lift more than 20 pounds for 6 weeks after surgery as it could put you at increased risk for complications.  Twenty pounds is roughly equivalent to a plastic bag of groceries. At that time- Listen to your body when lifting, if you have pain when lifting, stop and then try again in a few days. Soreness after doing exercises or activities of daily living is normal as you get back in to your normal routine.  MEDICATIONS:  Try to take narcotic medications and anti-inflammatory medications, such as tylenol, ibuprofen, naprosyn, etc., with food.  This will minimize stomach upset from the medication.  Should you develop nausea and vomiting from the pain medication, or develop a rash, please discontinue the medication and contact your physician.  You should not drive, make important decisions, or operate machinery when taking narcotic pain medication.  SUNBLOCK Use sun block to incision area over the next year if this area will be exposed to sun. This helps decrease scarring and will allow you avoid a permanent darkened area over your incision.  QUESTIONS:  Please feel free to call our office if you have any questions, and we will be glad to assist you.    

## 2019-11-12 ENCOUNTER — Other Ambulatory Visit: Payer: Self-pay

## 2019-11-12 ENCOUNTER — Ambulatory Visit
Admission: RE | Admit: 2019-11-12 | Discharge: 2019-11-12 | Disposition: A | Discharge status: Home / Self Care | Payer: Medicare HMO | Source: Ambulatory Visit | Attending: Family Medicine | Admitting: Family Medicine

## 2019-11-12 DIAGNOSIS — Z1231 Encounter for screening mammogram for malignant neoplasm of breast: Secondary | ICD-10-CM | POA: Insufficient documentation

## 2019-11-12 DIAGNOSIS — M85859 Other specified disorders of bone density and structure, unspecified thigh: Secondary | ICD-10-CM

## 2019-11-12 DIAGNOSIS — Z780 Asymptomatic menopausal state: Secondary | ICD-10-CM | POA: Insufficient documentation

## 2019-11-12 DIAGNOSIS — M85851 Other specified disorders of bone density and structure, right thigh: Secondary | ICD-10-CM | POA: Diagnosis not present

## 2019-11-12 DIAGNOSIS — R29890 Loss of height: Secondary | ICD-10-CM | POA: Diagnosis not present

## 2020-01-10 ENCOUNTER — Ambulatory Visit: Payer: Medicare HMO

## 2020-01-14 ENCOUNTER — Other Ambulatory Visit: Payer: Self-pay

## 2020-01-14 ENCOUNTER — Ambulatory Visit (INDEPENDENT_AMBULATORY_CARE_PROVIDER_SITE_OTHER): Payer: Medicare HMO | Admitting: Emergency Medicine

## 2020-01-14 ENCOUNTER — Ambulatory Visit: Payer: Medicare HMO

## 2020-01-14 DIAGNOSIS — Z23 Encounter for immunization: Secondary | ICD-10-CM | POA: Diagnosis not present

## 2020-02-05 DIAGNOSIS — R69 Illness, unspecified: Secondary | ICD-10-CM | POA: Diagnosis not present

## 2020-02-12 NOTE — Progress Notes (Signed)
Name: Laura Oneill   MRN: [id number]    DOB: 09-Apr-1950   Date:02/13/2020       Progress Note  Subjective  Chief Complaint  Acute visit/ Pain and burning with urination  HPI  Dysuria: she states symptoms started about one week ago, she increased water intake and cranberry juice and is feeling better, however continues to have supra pubic pressure, occasionally still has dysuria, urine is also cloudy and has an odor. Denies hematuria or back pain. No fever, chills, nausea or vomiting.  Denies hesitancy, but she has noticed nocturia. It feels like her previous UTI  She states she is unable to take bubble baths but has a hot tub and has been using for the past couple of weeks. It may be the chemicals in the water   Patient Active Problem List   Diagnosis Date Noted  . Epigastric hernia   . Torus fracture of distal end of right fibula 01/18/2018  . Obesity (BMI 30.0-34.9) 12/22/2017  . Serrated polyp of colon 12/22/2017  . Osteopenia 11/04/2017  . CKD (chronic kidney disease) stage 2, GFR 60-89 ml/min 09/05/2017  . Hyperlipidemia 08/25/2016  . Post-menopausal 06/24/2015    Past Surgical History:  Procedure Laterality Date  . COLONOSCOPY WITH PROPOFOL N/A 09/15/2017   Procedure: COLONOSCOPY WITH PROPOFOL;  Surgeon: Jonathon Bellows, MD;  Location: [facility] ENDOSCOPY;  Service: Gastroenterology;  Laterality: N/A;  . EPIGASTRIC HERNIA REPAIR N/A 10/05/2019   Procedure: HERNIA REPAIR EPIGASTRIC ADULT, open;  Surgeon: Fredirick Maudlin, MD;  Location: [facility] ORS;  Service: General;  Laterality: N/A;    Family History  Problem Relation Age of Onset  . Hypothyroidism Mother   . Dementia Mother   . COPD Father   . Breast cancer Neg Hx     Social History   Tobacco Use  . Smoking status: Never Smoker  . Smokeless tobacco: Never Used  Substance Use Topics  . Alcohol use: Yes    Alcohol/week: 0.0 standard drinks    Comment: occasional     Current Outpatient Medications:  .  Calcium  Carb-Cholecalciferol (CALCIUM 600+D3 PO), Take 1 tablet by mouth daily., Disp: , Rfl:  .  Glucosamine-Chondroitin (OSTEO BI-FLEX REGULAR STRENGTH PO), Take 2 tablets by mouth daily., Disp: , Rfl:  .  meloxicam (MOBIC) 7.5 MG tablet, Take 1 tablet (7.5 mg total) by mouth daily as needed for pain., Disp: 30 tablet, Rfl: 3 .  Multiple Vitamin (MULTIVITAMIN WITH MINERALS) TABS tablet, Take 1 tablet by mouth daily. Adults 50+, Disp: , Rfl:  .  Multiple Vitamins-Minerals (PRESERVISION AREDS 2 PO), Take 1 tablet by mouth in the morning and at bedtime. , Disp: , Rfl:  .  Omega-3 Fatty Acids (FISH OIL) 1000 MG CAPS, Take 1,000 mg by mouth daily. , Disp: , Rfl:   Allergies  Allergen Reactions  . Latex Rash    I personally reviewed active problem list, medication list, allergies, family history, social history, health maintenance with the patient/caregiver today.   ROS  Ten systems reviewed and is negative except as mentioned in HPI   Objective  Vitals:   02/13/20 0802  BP: 130/78  Pulse: 66  Resp: 15  Temp: 98 F (36.7 C)  TempSrc: Oral  SpO2: 99%  Weight: 180 lb (81.6 kg)  Height: 5\' 3"  (1.6 m)    Body mass index is 31.89 kg/m.  Physical Exam  Constitutional: Patient appears well-developed and well-nourished. Obese  No distress.  HEENT: head atraumatic, normocephalic, pupils equal and reactive to  light,  neck supple Cardiovascular: Normal rate, regular rhythm and normal heart sounds.  No murmur heard. No BLE edema. Pulmonary/Chest: Effort normal and breath sounds normal. No respiratory distress. Abdominal: Soft.  There is no tenderness.Negative CVA tenderness  Psychiatric: Patient has a normal mood and affect. behavior is normal. Judgment and thought content normal.  Recent Results (from the past 2160 hour(s))  POCT Urinalysis Dip Manual     Status: Abnormal   Collection Time: 02/13/20  8:03 AM  Result Value Ref Range   Spec Grav, UA <=1.005 (A) 1.010 - 1.025   pH, UA 8.0  5.0 - 8.0   Leukocytes, UA Moderate (2+) (A) Negative   Nitrite, UA Negative Negative   Poct Protein Negative Negative, trace mg/dL   Poct Glucose Normal Normal mg/dL   Poct Ketones Negative Negative   Poct Urobilinogen Normal Normal mg/dL   Poct Bilirubin Negative Negative   Poct Blood Negative Negative, trace     PHQ2/9: Depression screen [facility] 2/9 02/13/2020 09/18/2019 03/23/2019 09/19/2018 09/14/2018  Decreased Interest 0 0 0 0 0  Down, Depressed, Hopeless 0 0 0 0 0  PHQ - 2 Score 0 0 0 0 0  Altered sleeping - - 0 0 -  Tired, decreased energy - - 0 0 -  Change in appetite - - 0 0 -  Feeling bad or failure about yourself  - - 0 0 -  Trouble concentrating - - 0 0 -  Moving slowly or fidgety/restless - - 0 0 -  Suicidal thoughts - - 0 0 -  PHQ-9 Score - - 0 0 -  Difficult doing work/chores - - Not difficult at all Not difficult at all -    phq 9 is negative   Fall Risk: Fall Risk  02/13/2020 10/23/2019 09/25/2019 09/25/2019 09/18/2019  Falls in the past year? 0 0 0 0 0  Number falls in past yr: 0 0 - - 0  Injury with Fall? 0 0 - - 0  Risk for fall due to : - - - - No Fall Risks  Follow up - - - - Falls prevention discussed     Functional Status Survey: Is the patient deaf or have difficulty hearing?: No Does the patient have difficulty seeing, even when wearing glasses/contacts?: No Does the patient have difficulty concentrating, remembering, or making decisions?: No Does the patient have difficulty walking or climbing stairs?: No Does the patient have difficulty dressing or bathing?: No Does the patient have difficulty doing errands alone such as visiting a doctor's office or shopping?: No    Assessment & Plan  1. Dysuria  - POCT Urinalysis Dip Manual - Urine Culture - nitrofurantoin, macrocrystal-monohydrate, (MACROBID) 100 MG capsule; Take 1 capsule (100 mg total) by mouth 2 (two) times daily.  Dispense: 10 capsule; Refill: 0  2. Urine frequency  - POCT  Urinalysis Dip Manual - Urine Culture - nitrofurantoin, macrocrystal-monohydrate, (MACROBID) 100 MG capsule; Take 1 capsule (100 mg total) by mouth 2 (two) times daily.  Dispense: 10 capsule; Refill: 0

## 2020-02-13 ENCOUNTER — Encounter: Payer: Self-pay | Admitting: Family Medicine

## 2020-02-13 ENCOUNTER — Other Ambulatory Visit: Payer: Self-pay

## 2020-02-13 ENCOUNTER — Ambulatory Visit (INDEPENDENT_AMBULATORY_CARE_PROVIDER_SITE_OTHER): Payer: Medicare HMO | Admitting: Family Medicine

## 2020-02-13 VITALS — BP 130/78 | HR 66 | Temp 98.0°F | Resp 15 | Ht 63.0 in | Wt 180.0 lb

## 2020-02-13 DIAGNOSIS — R350 Frequency of micturition: Secondary | ICD-10-CM | POA: Diagnosis not present

## 2020-02-13 DIAGNOSIS — R300 Dysuria: Secondary | ICD-10-CM

## 2020-02-13 LAB — POCT URINALYSIS DIPSTICK (MANUAL)
Nitrite, UA: NEGATIVE
Poct Bilirubin: NEGATIVE
Poct Blood: NEGATIVE
Poct Glucose: NORMAL mg/dL
Poct Ketones: NEGATIVE
Poct Protein: NEGATIVE mg/dL
Poct Urobilinogen: NORMAL mg/dL
Spec Grav, UA: <=1.005 — AB (ref 1.010–1.025)
pH, UA: 8 (ref 5.0–8.0)

## 2020-02-13 MED ORDER — NITROFURANTOIN MONOHYD MACRO 100 MG PO CAPS: 100.0000 mg | ORAL_CAPSULE | Freq: Two times a day (BID) | ORAL | 0 refills | Status: DC

## 2020-02-13 NOTE — Addendum Note (Signed)
Addended by: Royal Hawthorn on: 02/13/2020 08:40 AM   Modules accepted: Orders

## 2020-02-16 LAB — URINE CULTURE
MICRO NUMBER:: 11187061
SPECIMEN QUALITY:: ADEQUATE

## 2020-02-18 ENCOUNTER — Other Ambulatory Visit: Payer: Self-pay | Admitting: Family Medicine

## 2020-02-18 DIAGNOSIS — N3000 Acute cystitis without hematuria: Secondary | ICD-10-CM

## 2020-02-18 MED ORDER — SULFAMETHOXAZOLE-TRIMETHOPRIM 400-80 MG PO TABS
1.0000 | ORAL_TABLET | Freq: Two times a day (BID) | ORAL | 0 refills | Status: DC
Start: 2020-02-18 — End: 2020-03-19

## 2020-03-19 ENCOUNTER — Other Ambulatory Visit: Payer: Self-pay

## 2020-03-19 ENCOUNTER — Ambulatory Visit (INDEPENDENT_AMBULATORY_CARE_PROVIDER_SITE_OTHER): Payer: Medicare HMO | Admitting: Family Medicine

## 2020-03-19 ENCOUNTER — Encounter: Payer: Self-pay | Admitting: Family Medicine

## 2020-03-19 VITALS — BP 118/82 | HR 69 | Temp 98.1°F | Resp 16 | Ht 63.0 in | Wt 180.1 lb

## 2020-03-19 DIAGNOSIS — M85859 Other specified disorders of bone density and structure, unspecified thigh: Secondary | ICD-10-CM | POA: Diagnosis not present

## 2020-03-19 DIAGNOSIS — E669 Obesity, unspecified: Secondary | ICD-10-CM | POA: Diagnosis not present

## 2020-03-19 DIAGNOSIS — N182 Chronic kidney disease, stage 2 (mild): Secondary | ICD-10-CM | POA: Diagnosis not present

## 2020-03-19 DIAGNOSIS — M1710 Unilateral primary osteoarthritis, unspecified knee: Secondary | ICD-10-CM | POA: Insufficient documentation

## 2020-03-19 DIAGNOSIS — M179 Osteoarthritis of knee, unspecified: Secondary | ICD-10-CM | POA: Insufficient documentation

## 2020-03-19 DIAGNOSIS — E782 Mixed hyperlipidemia: Secondary | ICD-10-CM | POA: Diagnosis not present

## 2020-03-19 NOTE — Patient Instructions (Addendum)
Zetia is a good alternate if you are weary of taking a statin  See american heart association cholesterol and statin information and the hand out to learn more about medicines that help lower cholesterol and lower risk    Recommendations on cholesterol and starting statins.   There is a benefit from LDL-C (bad cholesterol) lowering with statin therapy at virtually all levels of cardiovascular risk.  If statin therapy had no side effects and caused no financial burden, it might be reasonable to recommend it to virtually all at-risk individuals, similar to a healthy diet and exercise  It is this good of a medication!!  It reduces risk in almost everyone.   There are possible side effects with ALL medications and with statins there is a small subset of the population who may not metabolize it well, which causing muscle aches as a side effect (~5%).  We monitor for this, can test for this, and usually are careful to work with you to get a medication that gives you the benefits with minimal side effects.  Some people are sensitive to medications in general and we try to use the highest dose tolerated to give the most benefit.   [place] [facility] cholesterol and statin guidelines are as follows: In adults 62 to 68 years of age without diabetes mellitus and with LDL-C levels ?29, at a 10-year atherosclerotic cardiovascular disease risk of ?7.5 percent, start a moderate-intensity statin if a discussion of treatment options favors statin therapy  If LDL is >160, statins are indicated.  Patients with other significant risk factors would also benefit from statin.  Some of these factors include a family history of premature cardiovascular disease, chronic kidney disease, or chronic inflammatory disorder (such as chronic human immunodeficiency viral infection).   Can get more information at the following  website:  [employer].co.za

## 2020-03-19 NOTE — Progress Notes (Signed)
Name: STEPHENIE Oneill   MRN: [id number]    DOB: May 04, 1950   Date:03/19/2020       Progress Note  Chief Complaint  Patient presents with  . Hyperlipidemia     Subjective:   Laura Oneill is a 69 y.o. female, presents to clinic for   Hyperlipidemia: She has been wanting to monitor and manage with diet, if still not improved with rechecking labs today pt would like to start a medication to help lower Previously LDL was closer to 110, for the last year has increased, not exercising as much with COVID, but she still works on diet Last Lipids: Lab Results  Component Value Date   CHOL 222 (H) 09/18/2019   HDL 59 09/18/2019   LDLCALC 146 (H) 09/18/2019   TRIG 73 09/18/2019   CHOLHDL 3.8 09/18/2019   - Denies: Chest pain, shortness of breath, myalgias, claudication The 10-year ASCVD risk score Mikey Bussing DC Jr., et al., 2013) is: 6%   Values used to calculate the score:     Age: 43 years     Sex: Female     Is Non-Hispanic African American: No     Diabetic: No     Tobacco smoker: No     Systolic Blood Pressure: 937 mmHg     Is BP treated: No     HDL Cholesterol: 59 mg/dL     Total Cholesterol: 222 mg/dL   Osteoporosis taking calcium and Vit d -last bone density reviewed  Not able to exercise still like she used to -for both management of cholesterol and of osteoporosis  Her epigastric hernia was repaired by general surgery    Current Outpatient Medications:  .  Calcium Carb-Cholecalciferol (CALCIUM 600+D3 PO), Take 1 tablet by mouth daily., Disp: , Rfl:  .  Glucosamine-Chondroitin (OSTEO BI-FLEX REGULAR STRENGTH PO), Take 2 tablets by mouth daily., Disp: , Rfl:  .  Multiple Vitamin (MULTIVITAMIN WITH MINERALS) TABS tablet, Take 1 tablet by mouth daily. Adults 50+, Disp: , Rfl:  .  Multiple Vitamins-Minerals (PRESERVISION AREDS 2 PO), Take 1 tablet by mouth in the morning and at bedtime. , Disp: , Rfl:  .  Omega-3 Fatty Acids (FISH OIL) 1000 MG CAPS, Take 1,000 mg by  mouth daily. , Disp: , Rfl:  .  meloxicam (MOBIC) 7.5 MG tablet, Take 1 tablet (7.5 mg total) by mouth daily as needed for pain. (Patient not taking: Reported on 03/19/2020), Disp: 30 tablet, Rfl: 3  Patient Active Problem List   Diagnosis Date Noted  . Epigastric hernia   . Torus fracture of distal end of right fibula 01/18/2018  . Obesity (BMI 30.0-34.9) 12/22/2017  . Serrated polyp of colon 12/22/2017  . Osteopenia 11/04/2017  . CKD (chronic kidney disease) stage 2, GFR 60-89 ml/min 09/05/2017  . Hyperlipidemia 08/25/2016  . Post-menopausal 06/24/2015    Past Surgical History:  Procedure Laterality Date  . COLONOSCOPY WITH PROPOFOL N/A 09/15/2017   Procedure: COLONOSCOPY WITH PROPOFOL;  Surgeon: Jonathon Bellows, MD;  Location: [facility] ENDOSCOPY;  Service: Gastroenterology;  Laterality: N/A;  . EPIGASTRIC HERNIA REPAIR N/A 10/05/2019   Procedure: HERNIA REPAIR EPIGASTRIC ADULT, open;  Surgeon: Fredirick Maudlin, MD;  Location: [facility] ORS;  Service: General;  Laterality: N/A;    Family History  Problem Relation Age of Onset  . Hypothyroidism Mother   . Dementia Mother   . COPD Father   . Breast cancer Neg Hx     Social History   Tobacco Use  . Smoking status:  Never Smoker  . Smokeless tobacco: Never Used  Vaping Use  . Vaping Use: Never used  Substance Use Topics  . Alcohol use: Yes    Alcohol/week: 0.0 standard drinks    Comment: occasional  . Drug use: No     Allergies  Allergen Reactions  . Latex Rash    Health Maintenance  Topic Date Due  . COLONOSCOPY  09/15/2020  . MAMMOGRAM  11/11/2020  . DEXA SCAN  11/11/2021  . TETANUS/TDAP  01/27/2023  . INFLUENZA VACCINE  Completed  . COVID-19 Vaccine  Completed  . Hepatitis C Screening  Completed  . PNA vac Low Risk Adult  Completed    Chart Review Today: I personally reviewed active problem list, medication list, allergies, family history, social history, health maintenance, notes from last encounter, lab results,  imaging with the patient/caregiver today.   Review of Systems  10 Systems reviewed and are negative for acute change except as noted in the HPI.  Objective:   Vitals:   03/19/20 0917  BP: 118/82  Pulse: 69  Resp: 16  Temp: 98.1 F (36.7 C)  TempSrc: Oral  SpO2: 99%  Weight: 180 lb 1.6 oz (81.7 kg)  Height: 5\' 3"  (1.6 m)    Body mass index is 31.9 kg/m.  Physical Exam Vitals and nursing note reviewed.  Constitutional:      General: She is not in acute distress.    Appearance: Normal appearance. She is well-developed. She is not ill-appearing, toxic-appearing or diaphoretic.     Interventions: Face mask in place.  HENT:     Head: Normocephalic and atraumatic.     Right Ear: External ear normal.     Left Ear: External ear normal.  Eyes:     General: Lids are normal. No scleral icterus.       Right eye: No discharge.        Left eye: No discharge.     Conjunctiva/sclera: Conjunctivae normal.  Neck:     Trachea: Phonation normal. No tracheal deviation.  Cardiovascular:     Rate and Rhythm: Normal rate and regular rhythm.     Pulses: Normal pulses.          Radial pulses are 2+ on the right side and 2+ on the left side.       Posterior tibial pulses are 2+ on the right side and 2+ on the left side.     Heart sounds: Normal heart sounds. No murmur heard. No friction rub. No gallop.   Pulmonary:     Effort: Pulmonary effort is normal. No respiratory distress.     Breath sounds: Normal breath sounds. No stridor. No wheezing, rhonchi or rales.  Chest:     Chest wall: No tenderness.  Abdominal:     General: Bowel sounds are normal. There is no distension.     Palpations: Abdomen is soft.  Musculoskeletal:     Right lower leg: No edema.     Left lower leg: No edema.  Skin:    General: Skin is warm and dry.     Coloration: Skin is not jaundiced or pale.     Findings: No rash.  Neurological:     Mental Status: She is alert.     Motor: No abnormal muscle tone.      Gait: Gait normal.  Psychiatric:        Mood and Affect: Mood normal.        Speech: Speech normal.  Behavior: Behavior normal.         Assessment & Plan:     ICD-10-CM   1. Mixed hyperlipidemia  L84.5 COMPLETE METABOLIC PANEL WITH GFR    Lipid panel   She has been working on diet and taking omega-3 fatty acid wants labs rechecked considering statin versus zetia  2. CKD (chronic kidney disease) stage 2, GFR 60-89 ml/min  X64.6 COMPLETE METABOLIC PANEL WITH GFR   monitoring  3. Osteopenia of neck of femur, unspecified laterality  O03.212 COMPLETE METABOLIC PANEL WITH GFR   Getting appropriate calcium and vitamin D in her diet and and supplements encouraged her to get more exercise and weightbearing activity  4. Obesity (BMI 30.0-34.9)  E66.9    Weight roughly the same, she still would like to get back to [facility]  5. Osteoarthritis of knee, unspecified laterality, unspecified osteoarthritis type  M17.10    see below    Recheck labs - discussed HLD management options - statin recommended, if hesitant can try zetia - info on meds and HLD diet/lifestyle management reviewed and given in hand out  Knee pain - OA, intermittent - discussed referral to ortho - declines right now  Dexa/osteopenia reviewed scan done 11/2019 - adequate Vit D and Calcium, encouraged increasing exercise - recheck dexa in 2 years   No follow-ups on file.   Delsa Grana, PA-C 03/19/20 9:31 AM

## 2020-03-20 LAB — COMPLETE METABOLIC PANEL WITH GFR
AG Ratio: 2.4 (calc) (ref 1.0–2.5)
ALT: 15 U/L (ref 6–29)
AST: 15 U/L (ref 10–35)
Albumin: 4.3 g/dL (ref 3.6–5.1)
Alkaline phosphatase (APISO): 55 U/L (ref 37–153)
BUN: 15 mg/dL (ref 7–25)
CO2: 31 mmol/L (ref 20–32)
Calcium: 9.3 mg/dL (ref 8.6–10.4)
Chloride: 105 mmol/L (ref 98–110)
Creat: 0.77 mg/dL (ref 0.50–0.99)
GFR, Est African American: 93 mL/min/{1.73_m2} (ref >=60)
GFR, Est Non African American: 80 mL/min/{1.73_m2} (ref >=60)
Globulin: 1.8 g/dL (calc) — ABNORMAL LOW (ref 1.9–3.7)
Glucose, Bld: 84 mg/dL (ref 65–99)
Potassium: 4.8 mmol/L (ref 3.5–5.3)
Sodium: 141 mmol/L (ref 135–146)
Total Bilirubin: 0.5 mg/dL (ref 0.2–1.2)
Total Protein: 6.1 g/dL (ref 6.1–8.1)

## 2020-03-20 LAB — LIPID PANEL
Cholesterol: 219 mg/dL — ABNORMAL HIGH (ref <200)
HDL: 57 mg/dL (ref >=50)
LDL Cholesterol (Calc): 143 mg/dL (calc) — ABNORMAL HIGH
Non-HDL Cholesterol (Calc): 162 mg/dL (calc) — ABNORMAL HIGH (ref <130)
Total CHOL/HDL Ratio: 3.8 (calc) (ref <5.0)
Triglycerides: 86 mg/dL (ref <150)

## 2020-03-25 ENCOUNTER — Other Ambulatory Visit: Payer: Self-pay | Admitting: Family Medicine

## 2020-03-25 MED ORDER — ROSUVASTATIN CALCIUM 5 MG PO TABS
5.0000 mg | ORAL_TABLET | Freq: Every day | ORAL | 3 refills | Status: DC
Start: 2020-03-25 — End: 2021-03-06

## 2020-03-25 NOTE — Progress Notes (Signed)
Lab Results  Component Value Date   CHOL 219 (H) 03/19/2020   HDL 57 03/19/2020   LDLCALC 143 (H) 03/19/2020   TRIG 86 03/19/2020   CHOLHDL 3.8 03/19/2020   The 10-year ASCVD risk score Mikey Bussing DC Jr., et al., 2013) is: 6.1%   Values used to calculate the score:     Age: 69 years     Sex: Female     Is Non-Hispanic African American: No     Diabetic: No     Tobacco smoker: No     Systolic Blood Pressure: 540 mmHg     Is BP treated: No     HDL Cholesterol: 57 mg/dL     Total Cholesterol: 219 mg/dL  Pt was given option to monitory HLD with diet/lifestyle efforts or start low dose statin if she wished to - though ASCVD risk is still less than 7.5% and she could wait to start statin. She indicated to CMA she wanted statin - crestor 5 mg sent in - pt asked to do f/up OV in 3-4 months to monitor tolerance/recheck lipids/CMP

## 2020-05-12 ENCOUNTER — Encounter: Payer: Self-pay | Admitting: Family Medicine

## 2020-05-12 DIAGNOSIS — K635 Polyp of colon: Secondary | ICD-10-CM

## 2020-05-14 DIAGNOSIS — H524 Presbyopia: Secondary | ICD-10-CM | POA: Diagnosis not present

## 2020-05-14 DIAGNOSIS — H2513 Age-related nuclear cataract, bilateral: Secondary | ICD-10-CM | POA: Diagnosis not present

## 2020-05-20 NOTE — Telephone Encounter (Signed)
Patient has called back requesting a referral to see Dr. Vicente Males for a colonoscopy  Patient says that their spouse was provided a referral, and they would like one as well Please contact to advise

## 2020-05-28 ENCOUNTER — Other Ambulatory Visit: Payer: Self-pay

## 2020-05-28 ENCOUNTER — Telehealth (INDEPENDENT_AMBULATORY_CARE_PROVIDER_SITE_OTHER): Payer: Self-pay | Admitting: Gastroenterology

## 2020-05-28 DIAGNOSIS — Z86010 Personal history of colonic polyps: Secondary | ICD-10-CM

## 2020-05-28 MED ORDER — NA SULFATE-K SULFATE-MG SULF 17.5-3.13-1.6 GM/177ML PO SOLN: 354.0000 mL | Freq: Once | ORAL | 0 refills | Status: AC

## 2020-05-28 NOTE — Progress Notes (Signed)
Gastroenterology Pre-Procedure Review  Request Date: 09/17/2020 Requesting Physician: Dr. Vicente Males   PATIENT REVIEW QUESTIONS: The patient responded to the following health history questions as indicated:    1. Are you having any GI issues? no 2. Do you have a personal history of Polyps? Yes Dr. Vicente Males 09/2017 3. Do you have a family history of Colon Cancer or Polyps? no 4. Diabetes Mellitus? no 5. Joint replacements in the past 12 months?no 6. Major health problems in the past 3 months?no 7. Any artificial heart valves, MVP, or defibrillator?no    MEDICATIONS & ALLERGIES:    Patient reports the following regarding taking any anticoagulation/antiplatelet therapy:   Plavix, Coumadin, Eliquis, Xarelto, Lovenox, Pradaxa, Brilinta, or Effient? no Aspirin? no  Patient confirms/reports the following medications:  Current Outpatient Medications  Medication Sig Dispense Refill  . Calcium Carb-Cholecalciferol (CALCIUM 600+D3 PO) Take 1 tablet by mouth daily.    . Glucosamine-Chondroitin (OSTEO BI-FLEX REGULAR STRENGTH PO) Take 2 tablets by mouth daily.    . meloxicam (MOBIC) 7.5 MG tablet Take 1 tablet (7.5 mg total) by mouth daily as needed for pain. (Patient not taking: Reported on 03/19/2020) 30 tablet 3  . Multiple Vitamin (MULTIVITAMIN WITH MINERALS) TABS tablet Take 1 tablet by mouth daily. Adults 50+    . Multiple Vitamins-Minerals (PRESERVISION AREDS 2 PO) Take 1 tablet by mouth in the morning and at bedtime.     . Omega-3 Fatty Acids (FISH OIL) 1000 MG CAPS Take 1,000 mg by mouth daily.     . rosuvastatin (CRESTOR) 5 MG tablet Take 1 tablet (5 mg total) by mouth at bedtime. 90 tablet 3   No current facility-administered medications for this visit.    Patient confirms/reports the following allergies:  Allergies  Allergen Reactions  . Latex Rash    No orders of the defined types were placed in this encounter.   AUTHORIZATION INFORMATION Primary Insurance: 1D#: Group  #:  Secondary Insurance: 1D#: Group #:  SCHEDULE INFORMATION: Date:  Time: Location:

## 2020-07-17 ENCOUNTER — Other Ambulatory Visit: Payer: Self-pay

## 2020-07-17 ENCOUNTER — Ambulatory Visit (INDEPENDENT_AMBULATORY_CARE_PROVIDER_SITE_OTHER): Payer: Medicare HMO | Admitting: Family Medicine

## 2020-07-17 ENCOUNTER — Encounter: Payer: Self-pay | Admitting: Family Medicine

## 2020-07-17 VITALS — BP 124/76 | HR 98 | Temp 98.7°F | Resp 16 | Ht 63.0 in | Wt 178.2 lb

## 2020-07-17 DIAGNOSIS — E669 Obesity, unspecified: Secondary | ICD-10-CM

## 2020-07-17 DIAGNOSIS — E782 Mixed hyperlipidemia: Secondary | ICD-10-CM | POA: Diagnosis not present

## 2020-07-17 DIAGNOSIS — M85859 Other specified disorders of bone density and structure, unspecified thigh: Secondary | ICD-10-CM | POA: Diagnosis not present

## 2020-07-17 DIAGNOSIS — T753XXA Motion sickness, initial encounter: Secondary | ICD-10-CM | POA: Diagnosis not present

## 2020-07-17 MED ORDER — SCOPOLAMINE 1 MG/3DAYS TD PT72: 1.0000 | MEDICATED_PATCH | TRANSDERMAL | 2 refills | Status: DC

## 2020-07-17 NOTE — Patient Instructions (Signed)
High Cholesterol  High cholesterol is a condition in which the blood has high levels of a white, waxy substance similar to fat (cholesterol). The liver makes all the cholesterol that the body needs. The human body needs small amounts of cholesterol to help build cells. A person gets extra or excess cholesterol from the food that he or she eats. The blood carries cholesterol from the liver to the rest of the body. If you have high cholesterol, deposits (plaques) may build up on the walls of your arteries. Arteries are the blood vessels that carry blood away from your heart. These plaques make the arteries narrow and stiff. Cholesterol plaques increase your risk for heart attack and stroke. Work with your health care provider to keep your cholesterol levels in a healthy range. What increases the risk? The following factors may make you more likely to develop this condition:  Eating foods that are high in animal fat (saturated fat) or cholesterol.  Being overweight.  Not getting enough exercise.  A family history of high cholesterol (familial hypercholesterolemia).  Use of tobacco products.  Having diabetes. What are the signs or symptoms? There are no symptoms of this condition. How is this diagnosed? This condition may be diagnosed based on the results of a blood test.  If you are older than 69 years of age, your health care provider may check your cholesterol levels every 4-6 years.  You may be checked more often if you have high cholesterol or other risk factors for heart disease. The blood test for cholesterol measures:  "Bad" cholesterol, or LDL cholesterol. This is the main type of cholesterol that causes heart disease. The desired level is less than 100 mg/dL.  "Good" cholesterol, or HDL cholesterol. HDL helps protect against heart disease by cleaning the arteries and carrying the LDL to the liver for processing. The desired level for HDL is 60 mg/dL or higher.  Triglycerides.  These are fats that your body can store or burn for energy. The desired level is less than 150 mg/dL.  Total cholesterol. This measures the total amount of cholesterol in your blood and includes LDL, HDL, and triglycerides. The desired level is less than 200 mg/dL. How is this treated? This condition may be treated with:  Diet changes. You may be asked to eat foods that have more fiber and less saturated fats or added sugar.  Lifestyle changes. These may include regular exercise, maintaining a healthy weight, and quitting use of tobacco products.  Medicines. These are given when diet and lifestyle changes have not worked. You may be prescribed a statin medicine to help lower your cholesterol levels. Follow these instructions at home: Eating and drinking  Eat a healthy, balanced diet. This diet includes: ? Daily servings of a variety of fresh, frozen, or canned fruits and vegetables. ? Daily servings of whole grain foods that are rich in fiber. ? Foods that are low in saturated fats and trans fats. These include poultry and fish without skin, lean cuts of meat, and low-fat dairy products. ? A variety of fish, especially oily fish that contain omega-3 fatty acids. Aim to eat fish at least 2 times a week.  Avoid foods and drinks that have added sugar.  Use healthy cooking methods, such as roasting, grilling, broiling, baking, poaching, steaming, and stir-frying. Do not fry your food except for stir-frying.   Lifestyle  Get regular exercise. Aim to exercise for a total of 150 minutes a week. Increase your activity level by doing activities   such as gardening, walking, and taking the stairs.  Do not use any products that contain nicotine or tobacco, such as cigarettes, e-cigarettes, and chewing tobacco. If you need help quitting, ask your health care provider.   General instructions  Take over-the-counter and prescription medicines only as told by your health care provider.  Keep all  follow-up visits as told by your health care provider. This is important. Where to find more information  American Heart Association: www.heart.org  National Heart, Lung, and Blood Institute: www.nhlbi.nih.gov Contact a health care provider if:  You have trouble achieving or maintaining a healthy diet or weight.  You are starting an exercise program.  You are unable to stop smoking. Get help right away if:  You have chest pain.  You have trouble breathing.  You have any symptoms of a stroke. "BE FAST" is an easy way to remember the main warning signs of a stroke: ? B - Balance. Signs are dizziness, sudden trouble walking, or loss of balance. ? E - Eyes. Signs are trouble seeing or a sudden change in vision. ? F - Face. Signs are sudden weakness or numbness of the face, or the face or eyelid drooping on one side. ? A - Arms. Signs are weakness or numbness in an arm. This happens suddenly and usually on one side of the body. ? S - Speech. Signs are sudden trouble speaking, slurred speech, or trouble understanding what people say. ? T - Time. Time to call emergency services. Write down what time symptoms started.  You have other signs of a stroke, such as: ? A sudden, severe headache with no known cause. ? Nausea or vomiting. ? Seizure. These symptoms may represent a serious problem that is an emergency. Do not wait to see if the symptoms will go away. Get medical help right away. Call your local emergency services (911 in the [place] Do not drive yourself to the hospital. Summary  Cholesterol plaques increase your risk for heart attack and stroke. Work with your health care provider to keep your cholesterol levels in a healthy range.  Eat a healthy, balanced diet, get regular exercise, and maintain a healthy weight.  Do not use any products that contain nicotine or tobacco, such as cigarettes, e-cigarettes, and chewing tobacco.  Get help right away if you have any symptoms of a  stroke. This information is not intended to replace advice given to you by your health care provider. Make sure you discuss any questions you have with your health care provider. Document Revised: 02/19/2019 Document Reviewed: 02/19/2019 Elsevier Patient Education  2021 Elsevier Inc.  

## 2020-07-17 NOTE — Progress Notes (Signed)
4/14/202211:49 AM  Laura Oneill Nov 01, 1950, 70 y.o., female [id number]  Chief Complaint  Patient presents with  . Hyperlipidemia    HPI:   Patient is a 70 y.o. female with past medical history significant for HLD, OA, CKD who presents today for HLD follow up.  HLD Crestor 5 mg daily, started last OV At first was having bad dreams, that has since resolved Did have intermittent cramping of legs, now resolved Omega 3 with fish oil Has been working to manage with diet Is unable to exercise as much due to pain Lab Results  Component Value Date   CHOL 219 (H) 03/19/2020   HDL 57 03/19/2020   LDLCALC 143 (H) 03/19/2020   TRIG 86 03/19/2020   CHOLHDL 3.8 03/19/2020   The 10-year ASCVD risk score Mikey Bussing DC Jr., et al., 2013) is: 7.4%   Values used to calculate the score:     Age: 62 years     Sex: Female     Is Non-Hispanic African American: No     Diabetic: No     Tobacco smoker: No     Systolic Blood Pressure: 970 mmHg     Is BP treated: No     HDL Cholesterol: 57 mg/dL     Total Cholesterol: 219 mg/dL   Osteoporosis taking calcium and Vit d  Has a [place] cruise planned next week Would like scopolamine patch for motion sickness   Depression screen [facility] 2/9 07/17/2020 03/19/2020 02/13/2020  Decreased Interest 0 0 0  Down, Depressed, Hopeless 0 0 0  PHQ - 2 Score 0 0 0  Altered sleeping - - -  Tired, decreased energy - - -  Change in appetite - - -  Feeling bad or failure about yourself  - - -  Trouble concentrating - - -  Moving slowly or fidgety/restless - - -  Suicidal thoughts - - -  PHQ-9 Score - - -  Difficult doing work/chores - - -    Fall Risk  07/17/2020 03/19/2020 02/13/2020 10/23/2019 09/25/2019  Falls in the past year? 0 0 0 0 0  Number falls in past yr: 0 0 0 0 -  Injury with Fall? 0 0 0 0 -  Risk for fall due to : - - - - -  Follow up Falls evaluation completed Falls evaluation completed - - -     Allergies  Allergen Reactions  .  Latex Rash    Prior to Admission medications   Medication Sig Start Date End Date Taking? Authorizing Provider  Calcium Carb-Cholecalciferol (CALCIUM 600+D3 PO) Take 1 tablet by mouth daily.   Yes [provider]  Glucosamine-Chondroitin (OSTEO BI-FLEX REGULAR STRENGTH PO) Take 2 tablets by mouth daily.   Yes [provider]  meloxicam (MOBIC) 7.5 MG tablet Take 1 tablet (7.5 mg total) by mouth daily as needed for pain. 09/18/19  Yes Delsa Grana, PA-C  Multiple Vitamin (MULTIVITAMIN WITH MINERALS) TABS tablet Take 1 tablet by mouth daily. Adults 50+   Yes [provider]  Multiple Vitamins-Minerals (PRESERVISION AREDS 2 PO) Take 1 tablet by mouth in the morning and at bedtime.    Yes [provider]  Omega-3 Fatty Acids (FISH OIL) 1000 MG CAPS Take 1,000 mg by mouth daily.    Yes [provider]  rosuvastatin (CRESTOR) 5 MG tablet Take 1 tablet (5 mg total) by mouth at bedtime. 03/25/20  Yes Delsa Grana, PA-C    Past Medical History:  Diagnosis Date  .  Hyperlipidemia   . Macular degeneration, age related   . Osteopenia 11/04/2017   July 2019    Past Surgical History:  Procedure Laterality Date  . COLONOSCOPY WITH PROPOFOL N/A 09/15/2017   Procedure: COLONOSCOPY WITH PROPOFOL;  Surgeon: Jonathon Bellows, MD;  Location[facility] ENDOSCOPY;  Service: Gastroenterology;  Laterality: N/A;  . EPIGASTRIC HERNIA REPAIR N/A 10/05/2019   Procedure: HERNIA REPAIR EPIGASTRIC ADULT, open;  Surgeon: Fredirick Maudlin, MD;  Location: [facility] ORS;  Service: General;  Laterality: N/A;    Social History   Tobacco Use  . Smoking status: Never Smoker  . Smokeless tobacco: Never Used  Substance Use Topics  . Alcohol use: Yes    Alcohol/week: 0.0 standard drinks    Comment: occasional    Family History  Problem Relation Age of Onset  . Hypothyroidism Mother   . Dementia Mother   . COPD Father   . Breast cancer Neg Hx     Review of Systems  Constitutional:  Negative for chills, fever and malaise/fatigue.  Eyes: Negative for blurred vision and double vision.  Respiratory: Negative for cough, shortness of breath and wheezing.   Cardiovascular: Negative for chest pain, palpitations and leg swelling.  Gastrointestinal: Negative for abdominal pain, blood in stool, constipation, diarrhea, heartburn, nausea and vomiting.  Genitourinary: Negative for dysuria, frequency and hematuria.  Musculoskeletal: Negative for back pain and joint pain.  Skin: Negative for rash.  Neurological: Negative for dizziness, weakness and headaches.    OBJECTIVE:  Today's Vitals   07/17/20 1133  BP: 124/76  Pulse: 98  Resp: 16  Temp: 98.7 F (37.1 C)  TempSrc: Oral  SpO2: 99%  Weight: 178 lb 3.2 oz (80.8 kg)  Height: 5\' 3"  (1.6 m)   Body mass index is 31.57 kg/m.   Physical Exam Constitutional:      General: She is not in acute distress.    Appearance: Normal appearance. She is not ill-appearing.  HENT:     Head: Normocephalic.  Cardiovascular:     Rate and Rhythm: Normal rate and regular rhythm.     Pulses: Normal pulses.     Heart sounds: Normal heart sounds. No murmur heard. No friction rub. No gallop.   Pulmonary:     Effort: Pulmonary effort is normal. No respiratory distress.     Breath sounds: Normal breath sounds. No stridor. No wheezing, rhonchi or rales.  Abdominal:     General: Bowel sounds are normal.     Palpations: Abdomen is soft.     Tenderness: There is no abdominal tenderness.  Musculoskeletal:     Right lower leg: No edema.     Left lower leg: No edema.  Skin:    General: Skin is warm and dry.  Neurological:     Mental Status: She is alert and oriented to person, place, and time.  Psychiatric:        Mood and Affect: Mood normal.        Behavior: Behavior normal.     No results found for this or any previous visit (from the past 24 hour(s)).  No results found.   ASSESSMENT and PLAN  Problem List Items Addressed  This Visit      Musculoskeletal and Integument   Osteopenia     Other   Hyperlipidemia   Relevant Orders   Lipid Panel   Obesity (BMI 30.0-34.9) - Primary    Other Visit Diagnoses    Motion sickness, initial encounter       Relevant Medications  scopolamine (TRANSDERM-SCOP) 1 MG/3DAYS      Plan . Scopolamine patch sent for motion sickness, r/se/b discussed . Will follow up with lab results . Continue to work on diet and exercise   Return in about 6 months (around 01/16/2021).   Huston Foley Delisha Peaden, FNP-BC [facility]

## 2020-07-18 LAB — LIPID PANEL
Cholesterol: 145 mg/dL (ref <200)
HDL: 63 mg/dL (ref >=50)
LDL Cholesterol (Calc): 67 mg/dL (calc)
Non-HDL Cholesterol (Calc): 82 mg/dL (calc) (ref <130)
Total CHOL/HDL Ratio: 2.3 (calc) (ref <5.0)
Triglycerides: 73 mg/dL (ref <150)

## 2020-08-20 DIAGNOSIS — Z20822 Contact with and (suspected) exposure to covid-19: Secondary | ICD-10-CM | POA: Diagnosis not present

## 2020-09-03 DIAGNOSIS — Z20822 Contact with and (suspected) exposure to covid-19: Secondary | ICD-10-CM | POA: Diagnosis not present

## 2020-09-05 DIAGNOSIS — Z03818 Encounter for observation for suspected exposure to other biological agents ruled out: Secondary | ICD-10-CM | POA: Diagnosis not present

## 2020-09-05 DIAGNOSIS — Z20822 Contact with and (suspected) exposure to covid-19: Secondary | ICD-10-CM | POA: Diagnosis not present

## 2020-09-08 ENCOUNTER — Ambulatory Visit: Payer: Self-pay | Admitting: *Deleted

## 2020-09-08 ENCOUNTER — Telehealth: Payer: Self-pay | Admitting: Gastroenterology

## 2020-09-08 NOTE — Telephone Encounter (Signed)
Called patient back and she wanted to know if she needed to reschedule her colonoscopy procedure on June 15 th since she was diagnosed with COVID-19 on June 1 st. I asked if she had any symptoms and she stated that her case was a mild case since she is feeling well. Patient was told that I would call the endoscopy unit and ask if she needed to wait 21 days to be rescheduled or not. Patient agreed. I then called the endoscopy unit and spoke with Wannetta Sender and she stated that if the patient did not have any further symptoms, then she was okay to have her procedure done on 09/17/2020. I then called patient to let him know and she understood.

## 2020-09-08 NOTE — Telephone Encounter (Signed)
Pt went on a cruise and has now tested positive for Covid after symptoms started last Tuesday.   She was tested along with her daughter on wed.   Test results returned on Friday as positive.  "I just feel like I have a cold".     I went over the quarantine protocol and answered her questions.  She denied any risk factors other than her age.  She mentioned her daughter has Down's Syndrome and kidney issues but she is getting better too but mentioned she tested positive also.  I instructed her to go to the ED if she or her daughter were to experience shortness of breath or chest tightness or symptoms become worse to call us back.  I sent my notes to [facility] for Delsa Grana, PA-C for her information.      Reason for Disposition . [1] [id number] diagnosed by positive lab test (e.g., PCR, rapid self-test kit) AND [2] mild symptoms (e.g., cough, fever, others) AND [2] no complications or SOB  Answer Assessment - Initial Assessment Questions 1. COVID-19 DIAGNOSIS: "Who made your COVID-19 diagnosis?" "Was it confirmed by a positive lab test or self-test?" If not diagnosed by a doctor (or NP/PA), ask "Are there lots of cases (community spread) where you live?" Note: See public health department website, if unsure.     Wed. Last week had the test.   Friday results were positive 2. COVID-19 EXPOSURE: "Was there any known exposure to COVID before the symptoms began?" CDC Definition of close contact: within 6 feet (2 meters) for a total of 15 minutes or more over a 24-hour period.      Last Tuesday symptoms began.   Went on a cruise.  Returned Sat.   Symptoms Tues. 3. ONSET: "When did the COVID-19 symptoms start?"      Tuesday 4. WORST SYMPTOM: "What is your worst symptom?" (e.g., cough, fever, shortness of breath, muscle aches)     Stuffy head, runny nose and tired.      "I feel like I have a cold"  5. COUGH: "Do you have a cough?" If Yes, ask: "How bad is the cough?"       No   My  daughter has Downs syndrome and kidney problems and is positive too.   She seems to be getting better too.  6. FEVER: "Do you have a fever?" If Yes, ask: "What is your temperature, how was it measured, and when did it start?"     No fever 7. RESPIRATORY STATUS: "Describe your breathing?" (e.g., shortness of breath, wheezing, unable to speak)      No shortness of breath   8. BETTER-SAME-WORSE: "Are you getting better, staying the same or getting worse compared to yesterday?"  If getting worse, ask, "In what way?"     I'm feeling better overall than in the beginning.   I have a day where I feel better than the next but overall I'm feeling better. 9. HIGH RISK DISEASE: "Do you have any chronic medical problems?" (e.g., asthma, heart or lung disease, weak immune system, obesity, etc.)     No risk factors except age.  Denies any pulmonary or cardiac issues/history.  10. VACCINE: "Have you had the COVID-19 vaccine?" If Yes, ask: "Which one, how many shots, when did you get it?"       I've had the 2 vaccines 11. BOOSTER: "Have you received your COVID-19 booster?" If Yes, ask: "Which one and when did you get it?"  Both boosters 12. PREGNANCY: "Is there any chance you are pregnant?" "When was your last menstrual period?"       Not asked due to age 18. OTHER SYMPTOMS: "Do you have any other symptoms?"  (e.g., chills, fatigue, headache, loss of smell or taste, muscle pain, sore throat)      Had a mid headache in the beginning from all the congestion, fatigue 14. O2 SATURATION MONITOR:  "Do you use an oxygen saturation monitor (pulse oximeter) at home?" If Yes, ask "What is your reading (oxygen level) today?" "What is your usual oxygen saturation reading?" (e.g., 95%)       No  Protocols used: CORONAVIRUS (COVID-19) DIAGNOSED OR SUSPECTED-A-AH

## 2020-09-08 NOTE — Telephone Encounter (Signed)
Patient called Laura Oneill asking for call back concerning her upcoming procedure

## 2020-09-11 ENCOUNTER — Ambulatory Visit: Payer: Medicare HMO | Admitting: Family Medicine

## 2020-09-17 ENCOUNTER — Ambulatory Visit: Payer: Medicare HMO | Admitting: Anesthesiology

## 2020-09-17 ENCOUNTER — Encounter
Admission: RE | Disposition: A | Discharge status: Home / Self Care | Payer: Self-pay | Source: Home / Self Care | Attending: Gastroenterology

## 2020-09-17 ENCOUNTER — Ambulatory Visit
Admission: RE | Admit: 2020-09-17 | Discharge: 2020-09-17 | Disposition: A | Discharge status: Home / Self Care | Payer: Medicare HMO | Attending: Gastroenterology | Admitting: Gastroenterology

## 2020-09-17 DIAGNOSIS — K5730 Diverticulosis of large intestine without perforation or abscess without bleeding: Secondary | ICD-10-CM | POA: Diagnosis not present

## 2020-09-17 DIAGNOSIS — Z1211 Encounter for screening for malignant neoplasm of colon: Secondary | ICD-10-CM | POA: Diagnosis not present

## 2020-09-17 DIAGNOSIS — I4891 Unspecified atrial fibrillation: Secondary | ICD-10-CM | POA: Diagnosis not present

## 2020-09-17 DIAGNOSIS — Z79899 Other long term (current) drug therapy: Secondary | ICD-10-CM | POA: Insufficient documentation

## 2020-09-17 DIAGNOSIS — Z09 Encounter for follow-up examination after completed treatment for conditions other than malignant neoplasm: Secondary | ICD-10-CM | POA: Insufficient documentation

## 2020-09-17 DIAGNOSIS — Z01810 Encounter for preprocedural cardiovascular examination: Secondary | ICD-10-CM | POA: Diagnosis not present

## 2020-09-17 DIAGNOSIS — K640 First degree hemorrhoids: Secondary | ICD-10-CM | POA: Insufficient documentation

## 2020-09-17 DIAGNOSIS — Z86010 Personal history of colonic polyps: Secondary | ICD-10-CM | POA: Diagnosis not present

## 2020-09-17 DIAGNOSIS — Z91040 Latex allergy status: Secondary | ICD-10-CM | POA: Insufficient documentation

## 2020-09-17 DIAGNOSIS — K649 Unspecified hemorrhoids: Secondary | ICD-10-CM | POA: Diagnosis not present

## 2020-09-17 HISTORY — PX: COLONOSCOPY WITH PROPOFOL: SHX5780

## 2020-09-17 SURGERY — COLONOSCOPY WITH PROPOFOL
Anesthesia: General

## 2020-09-17 MED ORDER — LIDOCAINE HCL (CARDIAC) PF 100 MG/5ML IV SOSY
PREFILLED_SYRINGE | INTRAVENOUS | Status: DC | PRN
  Administered 2020-09-17: 25 mg via INTRAVENOUS

## 2020-09-17 MED ORDER — PROPOFOL 500 MG/50ML IV EMUL
INTRAVENOUS | Status: AC
  Filled 2020-09-17: qty 50

## 2020-09-17 MED ORDER — SODIUM CHLORIDE 0.9 % IV SOLN
INTRAVENOUS | Status: DC
  Administered 2020-09-17: 20 mL/h via INTRAVENOUS

## 2020-09-17 MED ORDER — PROPOFOL 10 MG/ML IV BOLUS
INTRAVENOUS | Status: DC | PRN
  Administered 2020-09-17: 70 mg via INTRAVENOUS
  Administered 2020-09-17: 30 mg via INTRAVENOUS

## 2020-09-17 MED ORDER — PROPOFOL 500 MG/50ML IV EMUL
INTRAVENOUS | Status: DC | PRN
  Administered 2020-09-17: 120 ug/kg/min via INTRAVENOUS

## 2020-09-17 NOTE — H&P (Signed)
Jonathon Bellows, MD [address] [place], [place], [place], [address] [address], [place], [place], [place], [address] Phone: [phone]  Fax: [phone]  Primary Care Physician:  Delsa Grana, PA-C   Pre-Procedure History & Physical: HPI:  Laura Oneill is a 70 y.o. female is here for an colonoscopy.   Past Medical History:  Diagnosis Date   Hyperlipidemia    Macular degeneration, age related    Osteopenia 11/04/2017   July 2019    Past Surgical History:  Procedure Laterality Date   COLONOSCOPY WITH PROPOFOL N/A 09/15/2017   Procedure: COLONOSCOPY WITH PROPOFOL;  Surgeon: Jonathon Bellows, MD;  Location: [facility] ENDOSCOPY;  Service: Gastroenterology;  Laterality: N/A;   EPIGASTRIC HERNIA REPAIR N/A 10/05/2019   Procedure: HERNIA REPAIR EPIGASTRIC ADULT, open;  Surgeon: Fredirick Maudlin, MD;  Location: [facility] ORS;  Service: General;  Laterality: N/A;    Prior to Admission medications   Medication Sig Start Date End Date Taking? Authorizing Provider  Calcium Carb-Cholecalciferol (CALCIUM 600+D3 PO) Take 1 tablet by mouth daily.   Yes [provider]  Glucosamine-Chondroitin (OSTEO BI-FLEX REGULAR STRENGTH PO) Take 2 tablets by mouth daily.   Yes [provider]  meloxicam (MOBIC) 7.5 MG tablet Take 1 tablet (7.5 mg total) by mouth daily as needed for pain. 09/18/19  Yes Delsa Grana, PA-C  Multiple Vitamin (MULTIVITAMIN WITH MINERALS) TABS tablet Take 1 tablet by mouth daily. Adults 50+   Yes [provider]  Multiple Vitamins-Minerals (PRESERVISION AREDS 2 PO) Take 1 tablet by mouth in the morning and at bedtime.    Yes [provider]  Omega-3 Fatty Acids (FISH OIL) 1000 MG CAPS Take 1,000 mg by mouth daily.    Yes [provider]  rosuvastatin (CRESTOR) 5 MG tablet Take 1 tablet (5 mg total) by mouth at bedtime. 03/25/20  Yes Delsa Grana, PA-C  scopolamine (TRANSDERM-SCOP) 1 MG/3DAYS Place 1 patch (1.5 mg total) onto the skin every 3  (three) days. 07/17/20  Yes Just, Laurita Quint, FNP    Allergies as of 05/28/2020 - Review Complete 03/19/2020  Allergen Reaction Noted   Latex Rash 08/25/2016    Family History  Problem Relation Age of Onset   Hypothyroidism Mother    Dementia Mother    COPD Father    Breast cancer Neg Hx     Social History   Socioeconomic History   Marital status: Married    Spouse name: Not on file   Number of children: 2   Years of education: Not on file   Highest education level: High school graduate  Occupational History   Occupation: retired  Tobacco Use   Smoking status: Never   Smokeless tobacco: Never  Vaping Use   Vaping Use: Never used  Substance and Sexual Activity   Alcohol use: Yes    Alcohol/week: 0.0 standard drinks    Comment: occasional   Drug use: No   Sexual activity: Yes  Other Topics Concern   Not on file  Social History Narrative   Not on file   Social Determinants of Health   Financial Resource Strain: Low Risk    Difficulty of Paying Living Expenses: Not hard at all  Food Insecurity: No Food Insecurity   Worried About Charity fundraiser in the Last Year: Never true   [place] in the Last Year: Never true  Transportation Needs: No Transportation Needs   Lack of Transportation (Medical): No   Lack of Transportation (Non-Medical): No  Physical Activity: Inactive   Days of Exercise per Week: 0 days   Minutes of Exercise per Session: 0 min  Stress: No Stress Concern Present   Feeling of Stress : Not at all  Social Connections: Moderately Integrated   Frequency of Communication with Friends and Family: More than three times a week   Frequency of Social Gatherings with Friends and Family: Three times a week   Attends Religious Services: More than 4 times per year   Active Member of Clubs or Organizations: No   Attends Archivist Meetings: Never   Marital Status: Married  Human resources officer Violence: Not At Risk   Fear of Current or  Ex-Partner: No   Emotionally Abused: No   Physically Abused: No   Sexually Abused: No    Review of Systems: See HPI, otherwise negative ROS  Physical Exam: BP (!) 142/82   Pulse 61   Temp (!) 97.2 F (36.2 C) (Temporal)   Resp 20   Ht 5\' 3"  (1.6 m)   Wt 80.3 kg   SpO2 98%   BMI 31.35 kg/m  General:   Alert,  pleasant and cooperative in NAD Head:  Normocephalic and atraumatic. Neck:  Supple; no masses or thyromegaly. Lungs:  Clear throughout to auscultation, normal respiratory effort.    Heart:  +S1, +S2, Regular rate and rhythm, No edema. Abdomen:  Soft, nontender and nondistended. Normal bowel sounds, without guarding, and without rebound.   Neurologic:  Alert and  oriented x4;  grossly normal neurologically.  Impression/Plan: Laura Oneill is here for an colonoscopy to be performed for surveillance due to prior history of colon polyps   Risks, benefits, limitations, and alternatives regarding  colonoscopy have been reviewed with the patient.  Questions have been answered.  All parties agreeable.   Jonathon Bellows, MD  09/17/2020, 12:16 PM

## 2020-09-17 NOTE — Transfer of Care (Signed)
Immediate Anesthesia Transfer of Care Note  Patient: Laura Oneill  Procedure(s) Performed: COLONOSCOPY WITH PROPOFOL  Patient Location: Endoscopy Unit  Anesthesia Type:General  Level of Consciousness: drowsy  Airway & Oxygen Therapy: Patient Spontanous Breathing  Post-op Assessment: Report given to RN and Post -op Vital signs reviewed and stable  Post vital signs: Reviewed  Last Vitals:  Vitals Value Taken Time  BP 100/52 09/17/20 1250  Temp    Pulse 48 09/17/20 1250  Resp 14 09/17/20 1250  SpO2 99 % 09/17/20 1250  Vitals shown include unvalidated device data.  Last Pain:  Vitals:   09/17/20 1126  TempSrc: Temporal  PainSc: 0-No pain         Complications: No notable events documented.

## 2020-09-17 NOTE — Op Note (Signed)
[facility] Patient Name: Laura Oneill Procedure Date: 09/17/2020 12:16 PM MRN: [id number] Account #: [id number] Date of Birth: September 12, 1950 Admit Type: Outpatient Age: 70 Room: [facility] ENDO ROOM 3 Gender: Female Note Status: Finalized Procedure:             Colonoscopy Indications:           Surveillance: Personal history of adenomatous polyps                         on last colonoscopy 3 years ago, Last colonoscopy:                         June 2019 Providers:             Jonathon Bellows MD, MD Referring MD:          Delsa Grana (Referring MD) Medicines:             Monitored Anesthesia Care Complications:         No immediate complications. Procedure:             Pre-Anesthesia Assessment:                        - Prior to the procedure, a History and Physical was                         performed, and patient medications, allergies and                         sensitivities were reviewed. The patient's tolerance                         of previous anesthesia was reviewed.                        - The risks and benefits of the procedure and the                         sedation options and risks were discussed with the                         patient. All questions were answered and informed                         consent was obtained.                        - ASA Grade Assessment: II - A patient with mild                         systemic disease.                        After obtaining informed consent, the colonoscope was                         passed under direct vision. Throughout the procedure,                         the patient's blood pressure, pulse, and oxygen  saturations were monitored continuously. The                         Colonoscope was introduced through the anus and                         advanced to the the cecum, identified by the                         appendiceal orifice. The colonoscopy was performed                          with ease. The patient tolerated the procedure well.                         The quality of the bowel preparation was good. Findings:      The perianal and digital rectal examinations were normal.      A few small-mouthed diverticula were found in the sigmoid colon.      Non-bleeding internal hemorrhoids were found during retroflexion. The       hemorrhoids were large and Grade I (internal hemorrhoids that do not       prolapse).      The exam was otherwise without abnormality on direct and retroflexion       views. Impression:            - Diverticulosis in the sigmoid colon.                        - Non-bleeding internal hemorrhoids.                        - The examination was otherwise normal on direct and                         retroflexion views.                        - No specimens collected. Recommendation:        - Discharge patient to home (with escort).                        - Resume previous diet.                        - Continue present medications.                        - No repeat colonoscopy due to current age (62 years                         or older). Procedure Code(s):     --- Professional ---                        [phone], Colonoscopy, flexible; diagnostic, including                         collection of specimen(s) by brushing or washing, when  performed (separate procedure) Diagnosis Code(s):     --- Professional ---                        Z86.010, Personal history of colonic polyps                        K64.0, First degree hemorrhoids                        K57.30, Diverticulosis of large intestine without                         perforation or abscess without bleeding CPT copyright 2019 American [employer]. All rights reserved. The codes documented in this report are preliminary and upon coder review may  be revised to meet current compliance requirements. Jonathon Bellows, MD Jonathon Bellows MD, MD 09/17/2020  12:48:30 PM This report has been signed electronically. Number of Addenda: 0 Note Initiated On: 09/17/2020 12:16 PM Scope Withdrawal Time: 0 hours 8 minutes 30 seconds  Total Procedure Duration: 0 hours 12 minutes 55 seconds  Estimated Blood Loss:  Estimated blood loss: none.      [facility]

## 2020-09-17 NOTE — Anesthesia Postprocedure Evaluation (Signed)
Anesthesia Post Note  Patient: Laura Oneill  Procedure(s) Performed: COLONOSCOPY WITH PROPOFOL  Patient location during evaluation: Phase II Anesthesia Type: General Level of consciousness: awake and alert, awake and oriented Pain management: pain level controlled Vital Signs Assessment: post-procedure vital signs reviewed and stable Respiratory status: spontaneous breathing, nonlabored ventilation and respiratory function stable Cardiovascular status: blood pressure returned to baseline, tachycardic and stable (Cardiology consulted for new onset afib.  Cleared to go home.) Postop Assessment: no apparent nausea or vomiting Anesthetic complications: no   No notable events documented.   Last Vitals:  Vitals:   09/17/20 1330 09/17/20 1400  BP: (!) 137/100 (!) 143/100  Pulse:    Resp: 19 15  Temp:    SpO2: 98% 97%    Last Pain:  Vitals:   09/17/20 1330  TempSrc:   PainSc: 0-No pain                 Phill Mutter

## 2020-09-17 NOTE — Anesthesia Preprocedure Evaluation (Signed)
Anesthesia Evaluation  Patient identified by MRN, date of birth, ID band Patient awake    Reviewed: Allergy & Precautions, H&P , NPO status , Patient's Chart, lab work & pertinent test results, reviewed documented beta blocker date and time   Airway Mallampati: II   Neck ROM: full    Dental no notable dental hx. (+) Teeth Intact   Pulmonary neg pulmonary ROS,    Pulmonary exam normal        Cardiovascular negative cardio ROS Normal cardiovascular exam Rhythm:regular Rate:Normal     Neuro/Psych negative neurological ROS  negative psych ROS   GI/Hepatic negative GI ROS, Neg liver ROS,   Endo/Other  negative endocrine ROS  Renal/GU Renal diseasenegative Renal ROS  negative genitourinary   Musculoskeletal  (+) Arthritis , Osteoarthritis,    Abdominal (+) + obese,   Peds  Hematology negative hematology ROS (+)   Anesthesia Other Findings Past Medical History: No date: Hyperlipidemia History reviewed. No pertinent surgical history. BMI    Body Mass Index:  32.92 kg/m     Reproductive/Obstetrics negative OB ROS                             Anesthesia Physical  Anesthesia Plan  ASA: 2  Anesthesia Plan: General   Post-op Pain Management:    Induction: Intravenous  PONV Risk Score and Plan: 2 and Propofol infusion and TIVA  Airway Management Planned: Natural Airway and Nasal Cannula  Additional Equipment:   Intra-op Plan:   Post-operative Plan: Extubation in OR  Informed Consent: I have reviewed the patients History and Physical, chart, labs and discussed the procedure including the risks, benefits and alternatives for the proposed anesthesia with the patient or authorized representative who has indicated his/her understanding and acceptance.     Dental Advisory Given  Plan Discussed with: CRNA and Anesthesiologist  Anesthesia Plan Comments:         Anesthesia Quick  Evaluation

## 2020-09-17 NOTE — Progress Notes (Signed)
In Recovery after GI procedure patient began exhibiting new onset A-fib. Anesthesia MD notified and EKG obtained for confirmation. Cardiology consulted with on-call cardiologist at bedside and patient is cleared to be discharged.

## 2020-09-18 ENCOUNTER — Other Ambulatory Visit: Payer: Self-pay

## 2020-09-18 ENCOUNTER — Ambulatory Visit (INDEPENDENT_AMBULATORY_CARE_PROVIDER_SITE_OTHER): Payer: Medicare HMO

## 2020-09-18 VITALS — BP 118/82 | HR 97 | Temp 98.3°F | Resp 16 | Ht 63.0 in | Wt 180.8 lb

## 2020-09-18 DIAGNOSIS — Z0000 Encounter for general adult medical examination without abnormal findings: Secondary | ICD-10-CM

## 2020-09-18 DIAGNOSIS — Z1231 Encounter for screening mammogram for malignant neoplasm of breast: Secondary | ICD-10-CM

## 2020-09-18 NOTE — Patient Instructions (Signed)
Laura Oneill , Thank you for taking time to come for your Medicare Wellness Visit. I appreciate your ongoing commitment to your health goals. Please review the following plan we discussed and let me know if I can assist you in the future.   Screening recommendations/referrals: Colonoscopy: done 09/17/20 Mammogram: done 11/12/19. Please call [phone] to schedule your mammogram.  Bone Density: done 11/12/19 Recommended yearly ophthalmology/optometry visit for glaucoma screening and checkup Recommended yearly dental visit for hygiene and checkup  Vaccinations: Influenza vaccine: done 01/14/20 Pneumococcal vaccine: done 09/14/18 Tdap vaccine: done 01/26/13 Shingles vaccine: done 01/07/20, 03/12/20   Covid-19:done 05/14/19, 06/11/19, 02/13/20 & 07/21/20  Advanced directives: Please bring a copy of your health care power of attorney and living will to the office at your convenience once you have completed those documents.   Conditions/risks identified: Recommend increasing physical activity.   Next appointment: Follow up in one year for your annual wellness visit    Preventive Care 65 Years and Older, Female Preventive care refers to lifestyle choices and visits with your health care provider that can promote health and wellness. What does preventive care include? A yearly physical exam. This is also called an annual well check. Dental exams once or twice a year. Routine eye exams. Ask your health care provider how often you should have your eyes checked. Personal lifestyle choices, including: Daily care of your teeth and gums. Regular physical activity. Eating a healthy diet. Avoiding tobacco and drug use. Limiting alcohol use. Practicing safe sex. Taking low-dose aspirin every day. Taking vitamin and mineral supplements as recommended by your health care provider. What happens during an annual well check? The services and screenings done by your health care provider during your annual well  check will depend on your age, overall health, lifestyle risk factors, and family history of disease. Counseling  Your health care provider may ask you questions about your: Alcohol use. Tobacco use. Drug use. Emotional well-being. Home and relationship well-being. Sexual activity. Eating habits. History of falls. Memory and ability to understand (cognition). Work and work Statistician. Reproductive health. Screening  You may have the following tests or measurements: Height, weight, and BMI. Blood pressure. Lipid and cholesterol levels. These may be checked every 5 years, or more frequently if you are over 18 years old. Skin check. Lung cancer screening. You may have this screening every year starting at age 68 if you have a 30-pack-year history of smoking and currently smoke or have quit within the past 15 years. Fecal occult blood test (FOBT) of the stool. You may have this test every year starting at age 41. Flexible sigmoidoscopy or colonoscopy. You may have a sigmoidoscopy every 5 years or a colonoscopy every 10 years starting at age 45. Hepatitis C blood test. Hepatitis B blood test. Sexually transmitted disease (STD) testing. Diabetes screening. This is done by checking your blood sugar (glucose) after you have not eaten for a while (fasting). You may have this done every 1-3 years. Bone density scan. This is done to screen for osteoporosis. You may have this done starting at age 95. Mammogram. This may be done every 1-2 years. Talk to your health care provider about how often you should have regular mammograms. Talk with your health care provider about your test results, treatment options, and if necessary, the need for more tests. Vaccines  Your health care provider may recommend certain vaccines, such as: Influenza vaccine. This is recommended every year. Tetanus, diphtheria, and acellular pertussis (Tdap, Td) vaccine. You may  need a Td booster every 10 years. Zoster  vaccine. You may need this after age 34. Pneumococcal 13-valent conjugate (PCV13) vaccine. One dose is recommended after age 8. Pneumococcal polysaccharide (PPSV23) vaccine. One dose is recommended after age 2. Talk to your health care provider about which screenings and vaccines you need and how often you need them. This information is not intended to replace advice given to you by your health care provider. Make sure you discuss any questions you have with your health care provider. Document Released: 04/18/2015 Document Revised: 12/10/2015 Document Reviewed: 01/21/2015 Elsevier Interactive Patient Education  2017 [place] Prevention in the Home Falls can cause injuries. They can happen to people of all ages. There are many things you can do to make your home safe and to help prevent falls. What can I do on the outside of my home? Regularly fix the edges of walkways and driveways and fix any cracks. Remove anything that might make you trip as you walk through a door, such as a raised step or threshold. Trim any bushes or trees on the path to your home. Use bright outdoor lighting. Clear any walking paths of anything that might make someone trip, such as rocks or tools. Regularly check to see if handrails are loose or broken. Make sure that both sides of any steps have handrails. Any raised decks and porches should have guardrails on the edges. Have any leaves, snow, or ice cleared regularly. Use sand or salt on walking paths during winter. Clean up any spills in your garage right away. This includes oil or grease spills. What can I do in the bathroom? Use night lights. Install grab bars by the toilet and in the tub and shower. Do not use towel bars as grab bars. Use non-skid mats or decals in the tub or shower. If you need to sit down in the shower, use a plastic, non-slip stool. Keep the floor dry. Clean up any water that spills on the floor as soon as it happens. Remove  soap buildup in the tub or shower regularly. Attach bath mats securely with double-sided non-slip rug tape. Do not have throw rugs and other things on the floor that can make you trip. What can I do in the bedroom? Use night lights. Make sure that you have a light by your bed that is easy to reach. Do not use any sheets or blankets that are too big for your bed. They should not hang down onto the floor. Have a firm chair that has side arms. You can use this for support while you get dressed. Do not have throw rugs and other things on the floor that can make you trip. What can I do in the kitchen? Clean up any spills right away. Avoid walking on wet floors. Keep items that you use a lot in easy-to-reach places. If you need to reach something above you, use a strong step stool that has a grab bar. Keep electrical cords out of the way. Do not use floor polish or wax that makes floors slippery. If you must use wax, use non-skid floor wax. Do not have throw rugs and other things on the floor that can make you trip. What can I do with my stairs? Do not leave any items on the stairs. Make sure that there are handrails on both sides of the stairs and use them. Fix handrails that are broken or loose. Make sure that handrails are as long as the stairways.  Check any carpeting to make sure that it is firmly attached to the stairs. Fix any carpet that is loose or worn. Avoid having throw rugs at the top or bottom of the stairs. If you do have throw rugs, attach them to the floor with carpet tape. Make sure that you have a light switch at the top of the stairs and the bottom of the stairs. If you do not have them, ask someone to add them for you. What else can I do to help prevent falls? Wear shoes that: Do not have high heels. Have rubber bottoms. Are comfortable and fit you well. Are closed at the toe. Do not wear sandals. If you use a stepladder: Make sure that it is fully opened. Do not climb a  closed stepladder. Make sure that both sides of the stepladder are locked into place. Ask someone to hold it for you, if possible. Clearly mark and make sure that you can see: Any grab bars or handrails. First and last steps. Where the edge of each step is. Use tools that help you move around (mobility aids) if they are needed. These include: Canes. Walkers. Scooters. Crutches. Turn on the lights when you go into a dark area. Replace any light bulbs as soon as they burn out. Set up your furniture so you have a clear path. Avoid moving your furniture around. If any of your floors are uneven, fix them. If there are any pets around you, be aware of where they are. Review your medicines with your doctor. Some medicines can make you feel dizzy. This can increase your chance of falling. Ask your doctor what other things that you can do to help prevent falls. This information is not intended to replace advice given to you by your health care provider. Make sure you discuss any questions you have with your health care provider. Document Released: 01/16/2009 Document Revised: 08/28/2015 Document Reviewed: 04/26/2014 Elsevier Interactive Patient Education  2017 [employer].

## 2020-09-18 NOTE — Progress Notes (Signed)
Subjective:   Laura Oneill is a 70 y.o. female who presents for Medicare Annual (Subsequent) preventive examination.  Review of Systems     Cardiac Risk Factors include: advanced age (>25men, >66 women);obesity (BMI >30kg/m2);dyslipidemia     Objective:    Today's Vitals   09/18/20 1005 09/18/20 1006  BP: 118/82   Pulse: 97   Resp: 16   Temp: 98.3 F (36.8 C)   TempSrc: Oral   SpO2: 99%   Weight: 180 lb 12.8 oz (82 kg)   Height: 5\' 3"  (1.6 m)   PainSc:  3    Body mass index is 32.03 kg/m.  Advanced Directives 09/18/2020 09/17/2020 10/05/2019 09/28/2019 09/18/2019 09/14/2018 12/24/2017  Does Patient Have a Medical Advance Directive? No No No No No No No  Type of Advance Directive - - - - - - -  Does patient want to make changes to medical advance directive? - - - - - No - Patient declined -  Copy of [place] in [place]  Would patient like information on creating a medical advance directive? No - Patient declined - No - Patient declined No - Patient declined No - Patient declined - -    Current Medications (verified) Outpatient Encounter Medications as of 09/18/2020  Medication Sig   Calcium Carb-Cholecalciferol (CALCIUM 600+D3 PO) Take 1 tablet by mouth daily.   Glucosamine-Chondroitin (OSTEO BI-FLEX REGULAR STRENGTH PO) Take 2 tablets by mouth daily.   meloxicam (MOBIC) 7.5 MG tablet Take 1 tablet (7.5 mg total) by mouth daily as needed for pain.   metoprolol succinate (TOPROL-XL) 25 MG 24 hr tablet Take 1 tablet by mouth daily.   Multiple Vitamin (MULTIVITAMIN WITH MINERALS) TABS tablet Take 1 tablet by mouth daily. Adults 50+   Multiple Vitamins-Minerals (PRESERVISION AREDS 2 PO) Take 1 tablet by mouth in the morning and at bedtime.    Omega-3 Fatty Acids (FISH OIL) 1000 MG CAPS Take 1,000 mg by mouth daily.    rosuvastatin (CRESTOR) 5 MG tablet Take 1 tablet (5 mg total) by mouth at bedtime.   [DISCONTINUED] scopolamine (TRANSDERM-SCOP) 1  MG/3DAYS Place 1 patch (1.5 mg total) onto the skin every 3 (three) days.   No facility-administered encounter medications on file as of 09/18/2020.    Allergies (verified) Latex   History: Past Medical History:  Diagnosis Date   Hyperlipidemia    Macular degeneration, age related    Osteopenia 11/04/2017   July 2019   Past Surgical History:  Procedure Laterality Date   COLONOSCOPY WITH PROPOFOL N/A 09/15/2017   Procedure: COLONOSCOPY WITH PROPOFOL;  Surgeon: Jonathon Bellows, MD;  Location: [facility] ENDOSCOPY;  Service: Gastroenterology;  Laterality: N/A;   EPIGASTRIC HERNIA REPAIR N/A 10/05/2019   Procedure: HERNIA REPAIR EPIGASTRIC ADULT, open;  Surgeon: Fredirick Maudlin, MD;  Location: [facility] ORS;  Service: General;  Laterality: N/A;   Family History  Problem Relation Age of Onset   Hypothyroidism Mother    Dementia Mother    COPD Father    Breast cancer Neg Hx    Social History   Socioeconomic History   Marital status: Married    Spouse name: Not on file   Number of children: 2   Years of education: Not on file   Highest education level: High school graduate  Occupational History   Occupation: retired  Tobacco Use   Smoking status: Never    Passive exposure: Never   Smokeless tobacco: Never  Tobacco comments:    Smoking cessation materials not required  Vaping Use   Vaping Use: Never used  Substance and Sexual Activity   Alcohol use: Yes    Alcohol/week: 0.0 standard drinks    Comment: occasional   Drug use: No   Sexual activity: Yes  Other Topics Concern   Not on file  Social History Narrative   Not on file   Social Determinants of Health   Financial Resource Strain: Low Risk    Difficulty of Paying Living Expenses: Not hard at all  Food Insecurity: No Food Insecurity   Worried About Charity fundraiser in the Last Year: Never true   [place] in the Last Year: Never true  Transportation Needs: No Transportation Needs   Lack of Transportation (Medical):  No   Lack of Transportation (Non-Medical): No  Physical Activity: Inactive   Days of Exercise per Week: 0 days   Minutes of Exercise per Session: 0 min  Stress: No Stress Concern Present   Feeling of Stress : Not at all  Social Connections: Moderately Integrated   Frequency of Communication with Friends and Family: More than three times a week   Frequency of Social Gatherings with Friends and Family: Three times a week   Attends Religious Services: More than 4 times per year   Active Member of Clubs or Organizations: No   Attends Archivist Meetings: Never   Marital Status: Married    Tobacco Counseling Counseling given: No Tobacco comments: Smoking cessation materials not required   Clinical Intake:  Pre-visit preparation completed: Yes  Pain : 0-10 Pain Score: 3  Pain Type: Acute pain Pain Location: Wrist (right leg) Pain Orientation: Left Pain Descriptors / Indicators: Aching, Sore Pain Onset: More than a month ago Pain Frequency: Intermittent     BMI - recorded: 32.03 Nutritional Status: BMI > 30  Obese Nutritional Risks: None Diabetes: No  How often do you need to have someone help you when you read instructions, pamphlets, or other written materials from your doctor or pharmacy?: 1 - Never    Interpreter Needed?: No  Information entered by :: Clemetine Marker LPN   Activities of Daily Living In your present state of health, do you have any difficulty performing the following activities: 09/18/2020 07/17/2020  Hearing? N N  Comment declines hearing aids -  Vision? N N  Difficulty concentrating or making decisions? N N  Walking or climbing stairs? N N  Dressing or bathing? N N  Doing errands, shopping? N N  Preparing Food and eating ? N -  Using the Toilet? N -  In the past six months, have you accidently leaked urine? N -  Do you have problems with loss of bowel control? N -  Managing your Medications? N -  Managing your Finances? N -   Housekeeping or managing your Housekeeping? N -  Some recent data might be hidden    Patient Care Team: Delsa Grana, PA-C as PCP - General (Family Medicine)  Indicate any recent Medical Services you may have received from other than [facility] providers in the past year (date may be approximate).     Assessment:   This is a routine wellness examination for [place].  Hearing/Vision screen Hearing Screening - Comments:: Pt denies hearing difficulty Vision Screening - Comments:: [facility] screenings at [facility] Dr. Thomasene Ripple  Dietary issues and exercise activities discussed: Current Exercise Habits: The patient does not participate in regular exercise at  present, Exercise limited by: None identified   Goals Addressed             This Visit's Progress    DIET - INCREASE WATER INTAKE       Recommend drinking 6-8 glasses of water per day         Depression Screen PHQ 2/9 Scores 09/18/2020 07/17/2020 03/19/2020 02/13/2020 09/18/2019 03/23/2019 09/19/2018  PHQ - 2 Score 0 0 0 0 0 0 0  PHQ- 9 Score - - - - - 0 0    Fall Risk Fall Risk  09/18/2020 07/17/2020 03/19/2020 02/13/2020 10/23/2019  Falls in the past year? 0 0 0 0 0  Number falls in past yr: 0 0 0 0 0  Injury with Fall? 0 0 0 0 0  Risk for fall due to : No Fall Risks - - - -  Follow up Falls prevention discussed Falls evaluation completed Falls evaluation completed - -    FALL RISK PREVENTION PERTAINING TO THE HOME:  Any stairs in or around the home? Yes  If so, are there any without handrails? Yes  - few steps outside Home free of loose throw rugs in walkways, pet beds, electrical cords, etc? Yes  Adequate lighting in your home to reduce risk of falls? Yes   ASSISTIVE DEVICES UTILIZED TO PREVENT FALLS:  Life alert? No  Use of a cane, walker or w/c? No  Grab bars in the bathroom? Yes  Shower chair or bench in shower? Yes  Elevated toilet seat or a handicapped toilet? No   TIMED UP AND GO:  Was the  test performed? Yes .  Length of time to ambulate 10 feet: 5 sec.   Gait steady and fast without use of assistive device  Cognitive Function:     6CIT Screen 09/18/2019 09/14/2018 09/05/2017  What Year? 0 points 0 points 0 points  What month? 0 points 0 points 0 points  What time? 0 points 0 points 0 points  Count back from 20 0 points 0 points 0 points  Months in reverse 0 points 2 points 0 points  Repeat phrase 0 points 0 points 2 points  Total Score 0 2 2    Immunizations Immunization History  Administered Date(s) Administered   Fluad Quad(high Dose 65+) 01/03/2019, 01/14/2020   Influenza, High Dose Seasonal PF 01/18/2018   Influenza,inj,Quad PF,6+ Mos 12/25/2014, 01/22/2016, 01/10/2017   Moderna Sars-Covid-2 Vaccination 05/14/2019, 06/11/2019, 02/13/2020, 07/21/2020   Pneumococcal Conjugate-13 09/05/2017   Pneumococcal Polysaccharide-23 09/14/2018   Tdap 01/26/2013   Zoster Recombinat (Shingrix) 01/07/2020, 03/12/2020   Zoster, Live 12/25/2014    TDAP status: Up to date  Flu Vaccine status: Up to date  Pneumococcal vaccine status: Up to date  Covid-19 vaccine status: Completed vaccines  Qualifies for Shingles Vaccine? Yes   Zostavax completed Yes   Shingrix Completed?: Yes  Screening Tests Health Maintenance  Topic Date Due   INFLUENZA VACCINE  11/03/2020   MAMMOGRAM  11/11/2020   DEXA SCAN  11/11/2021   TETANUS/TDAP  01/27/2023   COLONOSCOPY (Pts 45-22yrs Insurance coverage will need to be confirmed)  09/18/2023   COVID-19 Vaccine  Completed   Hepatitis C Screening  Completed   PNA vac Low Risk Adult  Completed   Zoster Vaccines- Shingrix  Completed   HPV VACCINES  Aged Out    Health Maintenance  There are no preventive care reminders to display for this patient.   Colorectal cancer screening: Type of screening: Colonoscopy. Completed 09/17/20. Repeat every  as directed years. Per report no longer required  Mammogram status: Completed 11/12/19. Repeat  every year  Bone Density status: Completed 11/12/19. Results reflect: Bone density results: OSTEOPENIA. Repeat every 2 years.  Lung Cancer Screening: (Low Dose CT Chest recommended if Age 55-80 years, 30 pack-year currently smoking OR have quit w/in 15years.) does not qualify.  Additional Screening:  Hepatitis C Screening: does qualify; Completed 08/25/16  Vision Screening: Recommended annual ophthalmology exams for early detection of glaucoma and other disorders of the eye. Is the patient up to date with their annual eye exam?  Yes  Who is the provider or what is the name of the office in which the patient attends annual eye exams? [facility]   Dental Screening: Recommended annual dental exams for proper oral hygiene  Community Resource Referral / Chronic Care Management: CRR required this visit?  No   CCM required this visit?  No      Plan:     I have personally reviewed and noted the following in the patient's chart:   Medical and social history Use of alcohol, tobacco or illicit drugs  Current medications and supplements including opioid prescriptions.  Functional ability and status Nutritional status Physical activity Advanced directives List of other physicians Hospitalizations, surgeries, and ER visits in previous 12 months Vitals Screenings to include cognitive, depression, and falls Referrals and appointments  In addition, I have reviewed and discussed with patient certain preventive protocols, quality metrics, and best practice recommendations. A written personalized care plan for preventive services as well as general preventive health recommendations were provided to patient.     Clemetine Marker, LPN   7/93/9030   Nurse Notes: pt c/o intermittent pain in left wrist; possible carpal tunnel, wears brace at night. Pt also c/o numbness and pain right right leg and toes only when sitting down. Pt advised to discuss with PCP.   Pt had colonoscopy yesterday  and exhibited new onset A-fib. Pt was prescribed metoprolol she plans to pick up today and awaiting appt with Dr. Clayborn Bigness for follow up.

## 2020-10-06 IMAGING — MG DIGITAL SCREENING BILAT W/ TOMO W/ CAD
6 of 10 series · 6 of 30 positions shown · non-contrast
Comparison: Previous exam(s).

CLINICAL DATA: Screening.

EXAM:
DIGITAL SCREENING BILATERAL MAMMOGRAM WITH TOMO AND CAD

[L MLO synth-2D]
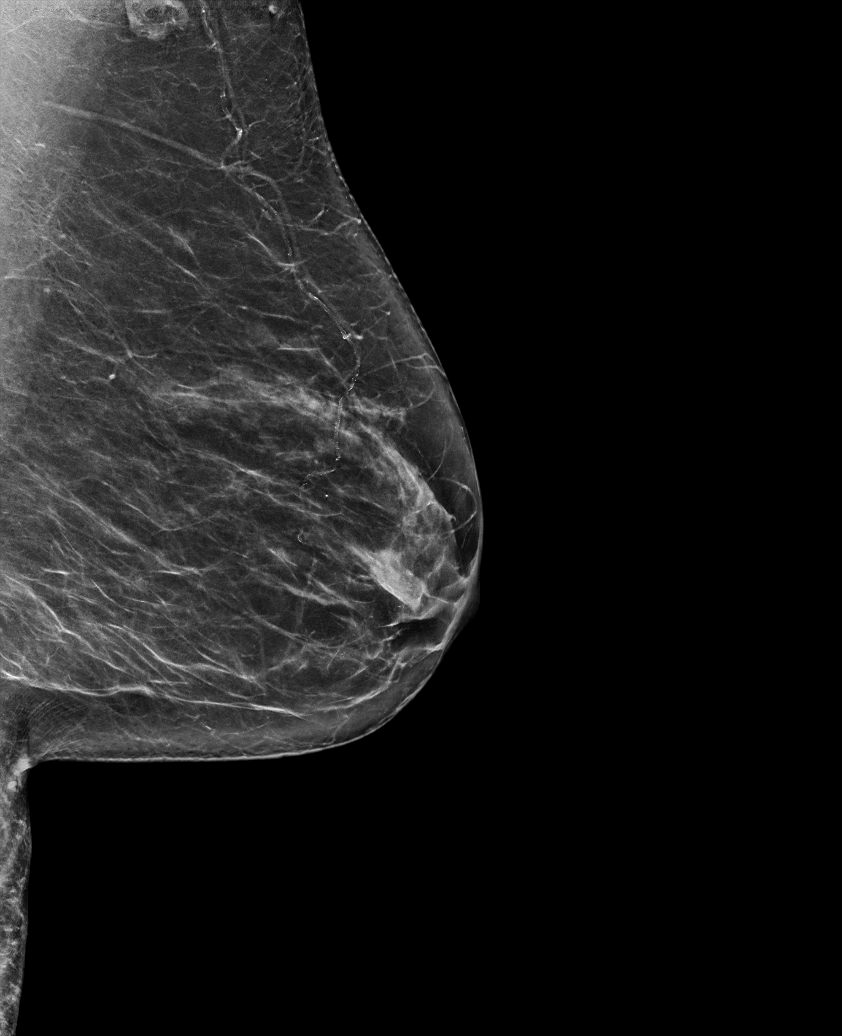

[R MLO synth-2D (1 of 2)]
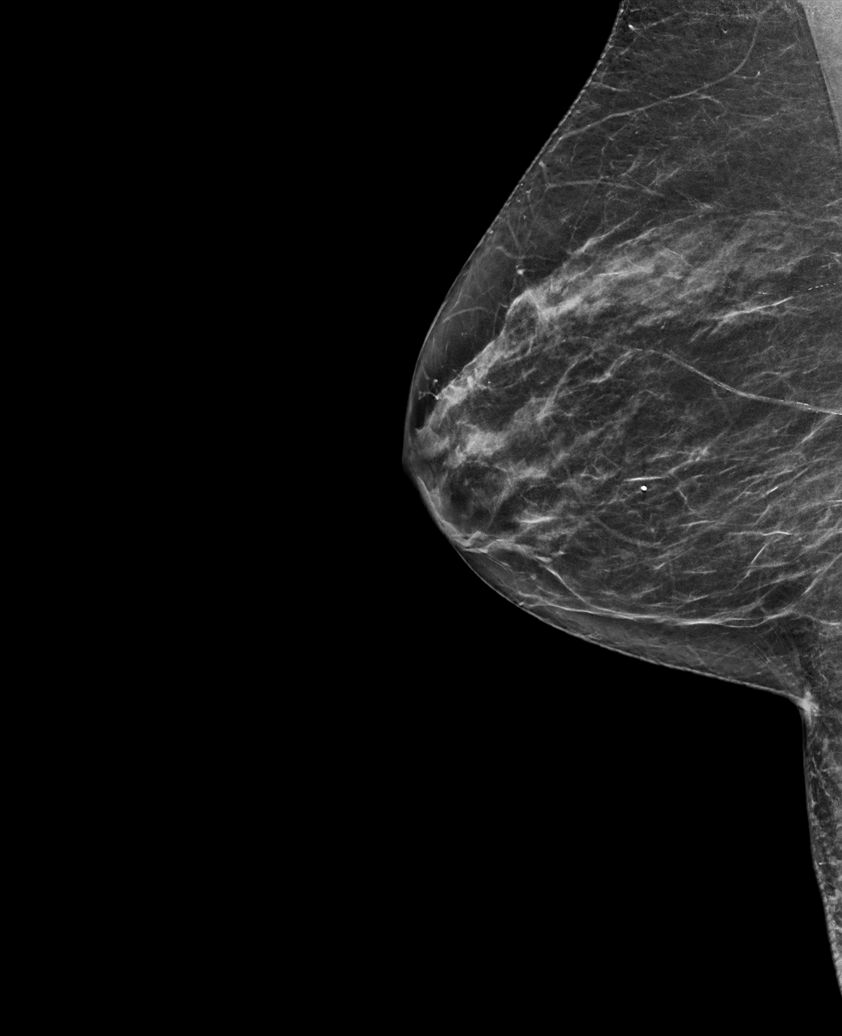

[R MLO synth-2D (2 of 2)]
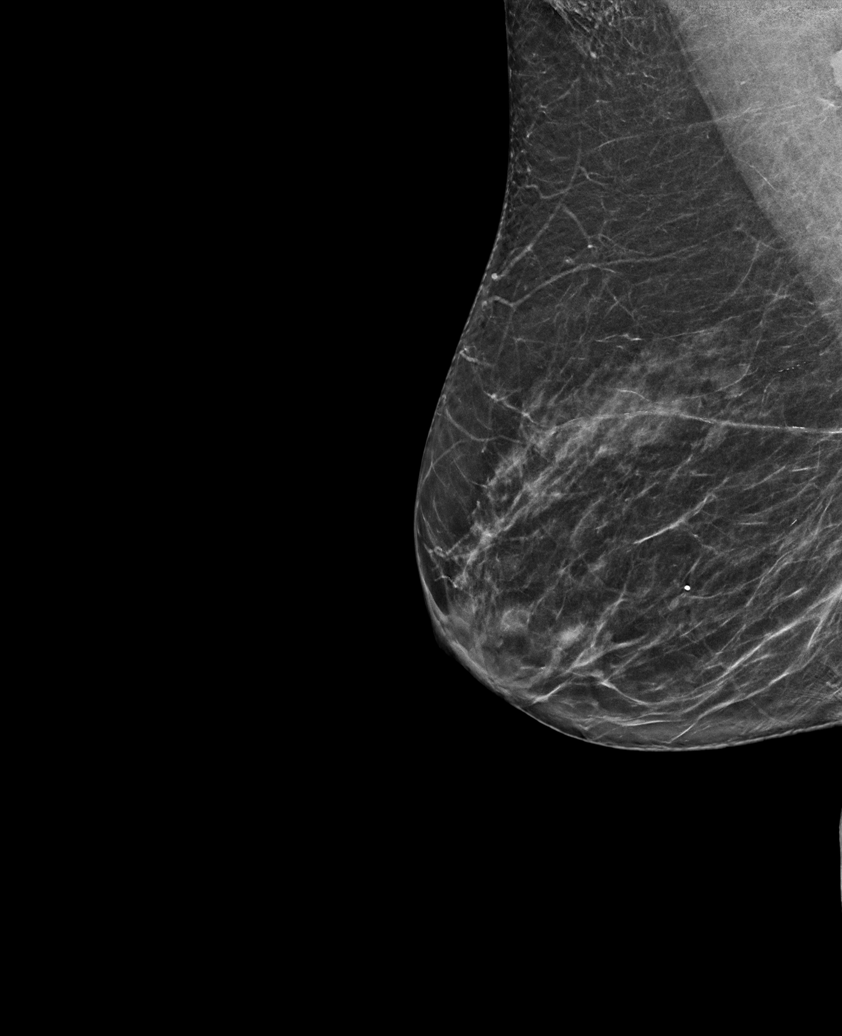

[R CC synth-2D]
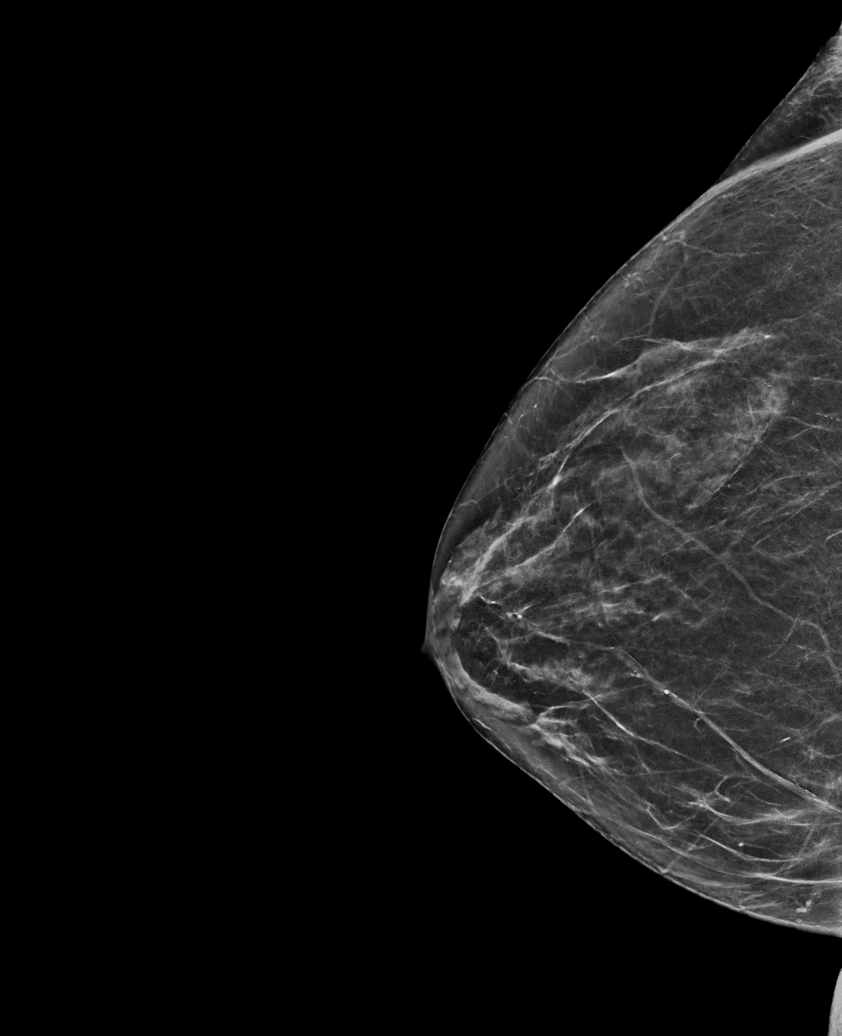

[L CC synth-2D]
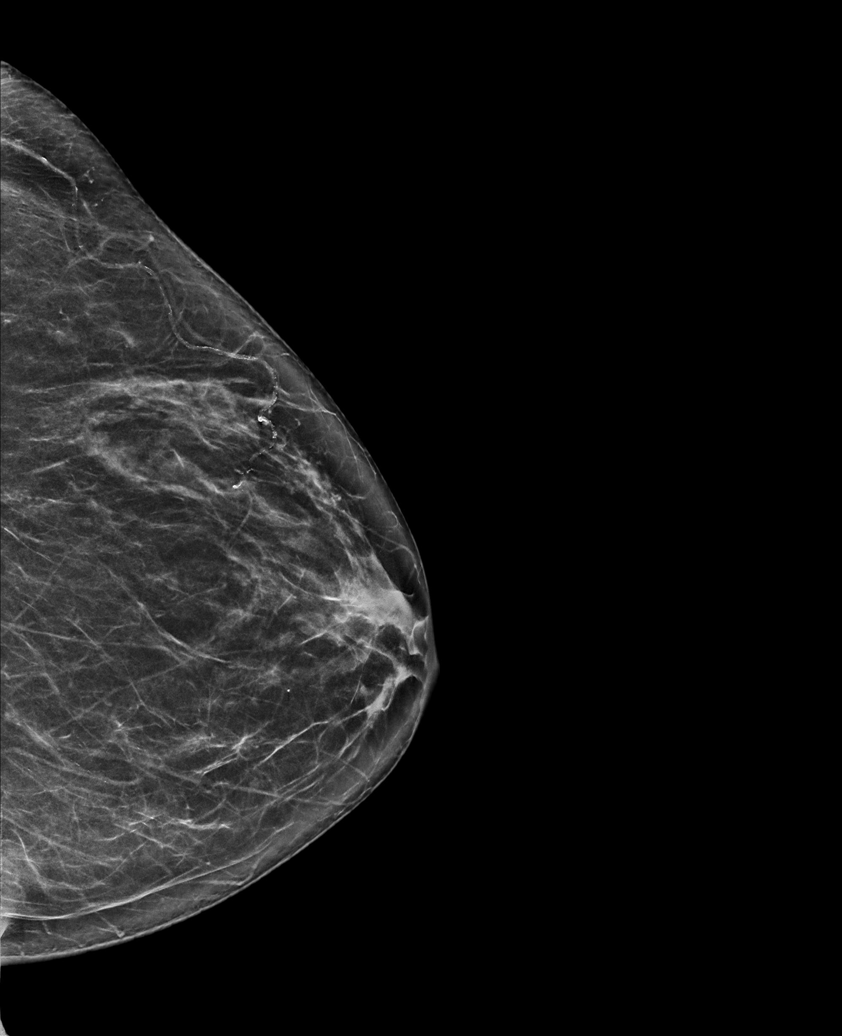

[R MLO tomo · tomo slice 33/64.0]
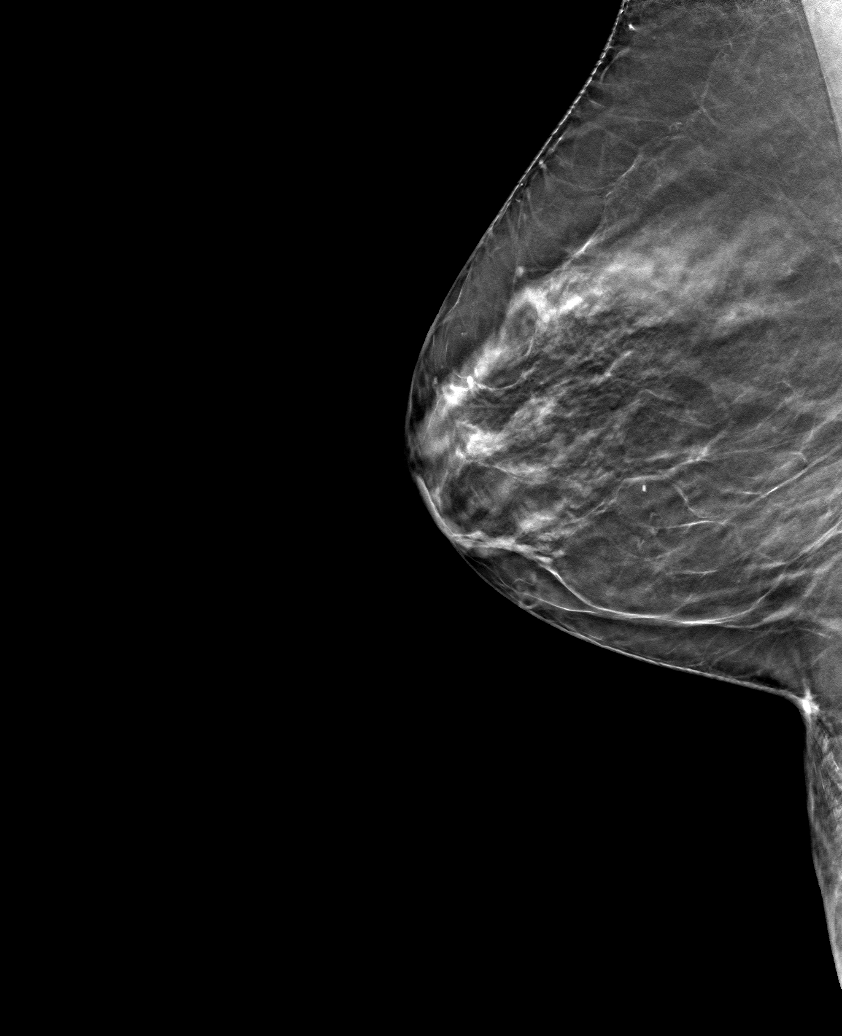

[6 of 30 positions shown; findings below may reference images not displayed]

ACR Breast Density Category b: There are scattered areas of
fibroglandular density.
FINDINGS: There are no findings suspicious for malignancy. Images were
processed with CAD.
IMPRESSION: No mammographic evidence of malignancy. A result letter of this
screening mammogram will be mailed directly to the patient.

RECOMMENDATION:
Screening mammogram in one year. (Code:[id number])

BI-RADS CATEGORY  1: Negative.

## 2020-10-27 ENCOUNTER — Telehealth: Payer: Self-pay | Admitting: *Deleted

## 2020-10-27 NOTE — Chronic Care Management (AMB) (Signed)
  Chronic Care Management   Note  10/27/2020 Name: Laura Oneill MRN: [id number] DOB: 1951-03-07  [name] is a 70 y.o. year old female who is a primary care patient of Delsa Grana, [place]. I reached out to Leatrice Jewels by phone today in response to a referral sent by [name] PCP Delsa Grana, PA-C     [name] was given information about Chronic Care Management services today including:  CCM service includes personalized support from designated clinical staff supervised by her physician, including individualized plan of care and coordination with other care providers 24/7 contact phone numbers for assistance for urgent and routine care needs. Service will only be billed when office clinical staff spend 20 minutes or more in a month to coordinate care. Only one practitioner may furnish and bill the service in a calendar month. The patient may stop CCM services at any time (effective at the end of the month) by phone call to the office staff. The patient will be responsible for cost sharing (co-pay) of up to 20% of the service fee (after annual deductible is met).  Patient agreed to services and verbal consent obtained.   Follow up plan: Telephone appointment with care management team member scheduled for: 11/05/2020  Julian Hy, [place], [place] Management  Direct Dial: [phone]

## 2020-10-28 DIAGNOSIS — Z03818 Encounter for observation for suspected exposure to other biological agents ruled out: Secondary | ICD-10-CM | POA: Diagnosis not present

## 2020-10-28 DIAGNOSIS — Z20822 Contact with and (suspected) exposure to covid-19: Secondary | ICD-10-CM | POA: Diagnosis not present

## 2020-11-03 DIAGNOSIS — I480 Paroxysmal atrial fibrillation: Secondary | ICD-10-CM | POA: Diagnosis not present

## 2020-11-03 DIAGNOSIS — R0683 Snoring: Secondary | ICD-10-CM | POA: Diagnosis not present

## 2020-11-03 DIAGNOSIS — I4891 Unspecified atrial fibrillation: Secondary | ICD-10-CM | POA: Diagnosis not present

## 2020-11-03 DIAGNOSIS — E785 Hyperlipidemia, unspecified: Secondary | ICD-10-CM | POA: Diagnosis not present

## 2020-11-05 ENCOUNTER — Telehealth: Payer: Medicare HMO

## 2020-11-12 ENCOUNTER — Other Ambulatory Visit: Payer: Self-pay

## 2020-11-12 ENCOUNTER — Ambulatory Visit
Admission: RE | Admit: 2020-11-12 | Discharge: 2020-11-12 | Disposition: A | Discharge status: Home / Self Care | Payer: Medicare HMO | Source: Ambulatory Visit | Attending: Family Medicine | Admitting: Family Medicine

## 2020-11-12 DIAGNOSIS — Z1231 Encounter for screening mammogram for malignant neoplasm of breast: Secondary | ICD-10-CM | POA: Insufficient documentation

## 2020-11-13 ENCOUNTER — Other Ambulatory Visit: Payer: Self-pay | Admitting: Family Medicine

## 2020-11-13 DIAGNOSIS — R928 Other abnormal and inconclusive findings on diagnostic imaging of breast: Secondary | ICD-10-CM

## 2020-11-13 DIAGNOSIS — N6489 Other specified disorders of breast: Secondary | ICD-10-CM

## 2020-11-19 ENCOUNTER — Ambulatory Visit
Admission: RE | Admit: 2020-11-19 | Discharge: 2020-11-19 | Disposition: A | Discharge status: Home / Self Care | Payer: Medicare HMO | Source: Ambulatory Visit | Attending: Family Medicine | Admitting: Family Medicine

## 2020-11-19 ENCOUNTER — Other Ambulatory Visit: Payer: Self-pay

## 2020-11-19 DIAGNOSIS — R928 Other abnormal and inconclusive findings on diagnostic imaging of breast: Secondary | ICD-10-CM | POA: Diagnosis not present

## 2020-11-19 DIAGNOSIS — N6489 Other specified disorders of breast: Secondary | ICD-10-CM | POA: Diagnosis not present

## 2020-11-19 DIAGNOSIS — R922 Inconclusive mammogram: Secondary | ICD-10-CM | POA: Diagnosis not present

## 2020-12-02 DIAGNOSIS — I4891 Unspecified atrial fibrillation: Secondary | ICD-10-CM | POA: Diagnosis not present

## 2020-12-05 DIAGNOSIS — I4891 Unspecified atrial fibrillation: Secondary | ICD-10-CM | POA: Diagnosis not present

## 2020-12-09 DIAGNOSIS — I4891 Unspecified atrial fibrillation: Secondary | ICD-10-CM | POA: Diagnosis not present

## 2020-12-09 DIAGNOSIS — Z23 Encounter for immunization: Secondary | ICD-10-CM | POA: Diagnosis not present

## 2020-12-09 DIAGNOSIS — E785 Hyperlipidemia, unspecified: Secondary | ICD-10-CM | POA: Diagnosis not present

## 2021-01-02 ENCOUNTER — Ambulatory Visit (INDEPENDENT_AMBULATORY_CARE_PROVIDER_SITE_OTHER): Payer: Medicare HMO

## 2021-01-02 ENCOUNTER — Other Ambulatory Visit: Payer: Self-pay

## 2021-01-02 DIAGNOSIS — Z23 Encounter for immunization: Secondary | ICD-10-CM | POA: Diagnosis not present

## 2021-01-14 DIAGNOSIS — I739 Peripheral vascular disease, unspecified: Secondary | ICD-10-CM | POA: Diagnosis not present

## 2021-01-14 DIAGNOSIS — E669 Obesity, unspecified: Secondary | ICD-10-CM | POA: Diagnosis not present

## 2021-01-14 DIAGNOSIS — Z7722 Contact with and (suspected) exposure to environmental tobacco smoke (acute) (chronic): Secondary | ICD-10-CM | POA: Diagnosis not present

## 2021-01-14 DIAGNOSIS — D6869 Other thrombophilia: Secondary | ICD-10-CM | POA: Diagnosis not present

## 2021-01-14 DIAGNOSIS — I4891 Unspecified atrial fibrillation: Secondary | ICD-10-CM | POA: Diagnosis not present

## 2021-01-14 DIAGNOSIS — H3530 Unspecified macular degeneration: Secondary | ICD-10-CM | POA: Diagnosis not present

## 2021-01-14 DIAGNOSIS — Z7901 Long term (current) use of anticoagulants: Secondary | ICD-10-CM | POA: Diagnosis not present

## 2021-01-14 DIAGNOSIS — Z6831 Body mass index (BMI) 31.0-31.9, adult: Secondary | ICD-10-CM | POA: Diagnosis not present

## 2021-01-14 DIAGNOSIS — R32 Unspecified urinary incontinence: Secondary | ICD-10-CM | POA: Diagnosis not present

## 2021-01-14 DIAGNOSIS — Z008 Encounter for other general examination: Secondary | ICD-10-CM | POA: Diagnosis not present

## 2021-01-14 DIAGNOSIS — E785 Hyperlipidemia, unspecified: Secondary | ICD-10-CM | POA: Diagnosis not present

## 2021-01-16 ENCOUNTER — Ambulatory Visit: Payer: Medicare HMO | Admitting: Nurse Practitioner

## 2021-01-20 ENCOUNTER — Encounter: Payer: Self-pay | Admitting: General Surgery

## 2021-01-22 ENCOUNTER — Encounter: Payer: Self-pay | Admitting: Nurse Practitioner

## 2021-01-22 ENCOUNTER — Other Ambulatory Visit: Payer: Self-pay

## 2021-01-22 ENCOUNTER — Ambulatory Visit (INDEPENDENT_AMBULATORY_CARE_PROVIDER_SITE_OTHER): Payer: Medicare HMO | Admitting: Nurse Practitioner

## 2021-01-22 VITALS — BP 124/72 | HR 62 | Temp 98.0°F | Resp 14 | Wt 186.0 lb

## 2021-01-22 DIAGNOSIS — E782 Mixed hyperlipidemia: Secondary | ICD-10-CM | POA: Diagnosis not present

## 2021-01-22 DIAGNOSIS — Z79899 Other long term (current) drug therapy: Secondary | ICD-10-CM

## 2021-01-22 DIAGNOSIS — Z5181 Encounter for therapeutic drug level monitoring: Secondary | ICD-10-CM | POA: Diagnosis not present

## 2021-01-22 DIAGNOSIS — I4891 Unspecified atrial fibrillation: Secondary | ICD-10-CM

## 2021-01-22 DIAGNOSIS — M5431 Sciatica, right side: Secondary | ICD-10-CM | POA: Diagnosis not present

## 2021-01-22 NOTE — Progress Notes (Signed)
BP 124/72   Pulse 62   Temp 98 F (36.7 C) (Oral)   Resp 14   Wt 186 lb (84.4 kg)   SpO2 99%   BMI 32.95 kg/m    Subjective:    Patient ID: Laura Oneill, female    DOB: 02/05/1951, 70 y.o.   MRN: [id number]  HPI: WANDALEE KLANG is a 70 y.o. female  Chief Complaint  Patient presents with   Hyperlipidemia    6 month follow up   Hyperlipidemia: She says she has been taking her rosuvastatin 5 mg daily.  She denies any myalgia.  Last LDL was 67 on 07/17/20.  Will recheck labs today.   Atrial Fibrillation:  She had her colonoscopy on 09/17/20.  When she was coming out of anesthesia she was in atrial fibrillation.  She is seeing cardiology at the [facility]. She is currently on eliquis and metoprolol. She denies any chest pain or palpitations at this time.   Right buttocks and leg pain: She says she has noticed a sharp pain in her right buttocks that shoots down her right leg when she gets up from a sitting position.  She says the pain is tolerable.  4/10.  Discussed OTC treatments and stretches she can do.   Relevant past medical, surgical, family and social history reviewed and updated as indicated. Interim medical history since our last visit reviewed. Allergies and medications reviewed and updated.  Review of Systems  Constitutional: Negative for fever or weight change.  Respiratory: Negative for cough and shortness of breath.   Cardiovascular: Negative for chest pain, positive for palpitations.  Gastrointestinal: Negative for abdominal pain, no bowel changes.  Musculoskeletal: Negative for gait problem or joint swelling. Positive for right buttocks pain radiates down right leg Skin: Negative for rash.  Neurological: Negative for dizziness or headache.  No other specific complaints in a complete review of systems (except as listed in HPI above).      Objective:    BP 124/72   Pulse 62   Temp 98 F (36.7 C) (Oral)   Resp 14   Wt 186 lb (84.4 kg)   SpO2 99%    BMI 32.95 kg/m   Wt Readings from Last 3 Encounters:  01/22/21 186 lb (84.4 kg)  09/18/20 180 lb 12.8 oz (82 kg)  09/17/20 177 lb (80.3 kg)    Physical Exam  Constitutional: Patient appears well-developed and well-nourished. No distress.  HEENT: head atraumatic, normocephalic, pupils equal and reactive to light,  neck supple Cardiovascular: Normal rate, regular rhythm and normal heart sounds.  No murmur heard. No BLE edema. Pulmonary/Chest: Effort normal and breath sounds normal. No respiratory distress. Abdominal: Soft.  There is no tenderness. Psychiatric: Patient has a normal mood and affect. behavior is normal. Judgment and thought content normal.   Results for orders placed or performed in visit on 07/17/20  Lipid Panel  Result Value Ref Range   Cholesterol 145 <200 mg/dL   HDL 63 > OR = 50 mg/dL   Triglycerides 73 <150 mg/dL   LDL Cholesterol (Calc) 67 mg/dL (calc)   Total CHOL/HDL Ratio 2.3 <5.0 (calc)   Non-HDL Cholesterol (Calc) 82 <130 mg/dL (calc)      Assessment & Plan:   1. Mixed hyperlipidemia  - COMPLETE METABOLIC PANEL WITH GFR - Lipid panel  2. Atrial fibrillation, unspecified type ([place])   3. Encounter for medication monitoring  - COMPLETE METABOLIC PANEL WITH GFR - Lipid panel  4. Long-term use of  high-risk medication  - COMPLETE METABOLIC PANEL WITH GFR - Lipid panel  5. Sciatica of right side -OTC treatments -stretches     Follow up plan: Return in about 6 months (around 07/23/2021) for follow up.

## 2021-01-23 LAB — COMPLETE METABOLIC PANEL WITH GFR
AG Ratio: 2.4 (calc) (ref 1.0–2.5)
ALT: 15 U/L (ref 6–29)
AST: 16 U/L (ref 10–35)
Albumin: 4.3 g/dL (ref 3.6–5.1)
Alkaline phosphatase (APISO): 55 U/L (ref 37–153)
BUN: 22 mg/dL (ref 7–25)
CO2: 27 mmol/L (ref 20–32)
Calcium: 9.1 mg/dL (ref 8.6–10.4)
Chloride: 105 mmol/L (ref 98–110)
Creat: 0.78 mg/dL (ref 0.50–1.05)
Globulin: 1.8 g/dL (calc) — ABNORMAL LOW (ref 1.9–3.7)
Glucose, Bld: 88 mg/dL (ref 65–99)
Potassium: 4.8 mmol/L (ref 3.5–5.3)
Sodium: 140 mmol/L (ref 135–146)
Total Bilirubin: 0.4 mg/dL (ref 0.2–1.2)
Total Protein: 6.1 g/dL (ref 6.1–8.1)
eGFR: 83 mL/min/{1.73_m2} (ref >=60)

## 2021-01-23 LAB — LIPID PANEL
Cholesterol: 158 mg/dL (ref <200)
HDL: 59 mg/dL (ref >=50)
LDL Cholesterol (Calc): 84 mg/dL (calc)
Non-HDL Cholesterol (Calc): 99 mg/dL (calc) (ref <130)
Total CHOL/HDL Ratio: 2.7 (calc) (ref <5.0)
Triglycerides: 73 mg/dL (ref <150)

## 2021-03-05 ENCOUNTER — Other Ambulatory Visit: Payer: Self-pay | Admitting: Family Medicine

## 2021-03-06 NOTE — Telephone Encounter (Signed)
Requested Prescriptions  Pending Prescriptions Disp Refills  . rosuvastatin (CRESTOR) 5 MG tablet [Pharmacy Med Name: Rosuvastatin Calcium 5 MG Oral Tablet] 90 tablet 0    Sig: TAKE 1 TABLET BY MOUTH AT BEDTIME     Cardiovascular:  Antilipid - Statins Passed - 03/05/2021  4:21 AM      Passed - Total Cholesterol in normal range and within 360 days    Cholesterol, Total  Date Value Ref Range Status  06/27/2015 216 (H) 100 - 199 mg/dL Final   Cholesterol  Date Value Ref Range Status  01/22/2021 158 <200 mg/dL Final         Passed - LDL in normal range and within 360 days    LDL Cholesterol (Calc)  Date Value Ref Range Status  01/22/2021 84 mg/dL (calc) Final    Comment:    Reference range: <100 . Desirable range <100 mg/dL for primary prevention;   <70 mg/dL for patients with CHD or diabetic patients  with > or = 2 CHD risk factors. Marland Kitchen LDL-C is now calculated using the Martin-Hopkins  calculation, which is a validated novel method providing  better accuracy than the Friedewald equation in the  estimation of LDL-C.  Cresenciano Genre et al. Annamaria Helling. 0174;944(96): 2061-2068  (http://education.[employer].com/faq/FAQ164)          Passed - HDL in normal range and within 360 days    HDL  Date Value Ref Range Status  01/22/2021 59 > OR = 50 mg/dL Final  06/27/2015 60 >39 mg/dL Final         Passed - Triglycerides in normal range and within 360 days    Triglycerides  Date Value Ref Range Status  01/22/2021 73 <150 mg/dL Final         Passed - Patient is not pregnant      Passed - Valid encounter within last 12 months    Recent Outpatient Visits          1 month ago Mixed hyperlipidemia   [facility] Serafina Royals F, FNP   7 months ago Obesity (BMI 30.0-34.9)   [facility] Just, Laurita Quint, FNP   11 months ago Mixed hyperlipidemia   [facility] Delsa Grana, [place]   1 year ago [facility]  Steele Sizer, MD   1 year ago Mixed hyperlipidemia   [facility] Delsa Grana, PA-C      Future Appointments            In 4 months Delsa Grana, PA-C [facility], [place]   In 6 months  [facility], [facility]

## 2021-04-28 DIAGNOSIS — E785 Hyperlipidemia, unspecified: Secondary | ICD-10-CM | POA: Diagnosis not present

## 2021-04-28 DIAGNOSIS — R002 Palpitations: Secondary | ICD-10-CM | POA: Diagnosis not present

## 2021-04-28 DIAGNOSIS — Z6833 Body mass index (BMI) 33.0-33.9, adult: Secondary | ICD-10-CM | POA: Diagnosis not present

## 2021-04-28 DIAGNOSIS — R0683 Snoring: Secondary | ICD-10-CM | POA: Diagnosis not present

## 2021-04-28 DIAGNOSIS — I4891 Unspecified atrial fibrillation: Secondary | ICD-10-CM | POA: Diagnosis not present

## 2021-04-28 DIAGNOSIS — G4733 Obstructive sleep apnea (adult) (pediatric): Secondary | ICD-10-CM | POA: Diagnosis not present

## 2021-04-28 DIAGNOSIS — E6609 Other obesity due to excess calories: Secondary | ICD-10-CM | POA: Diagnosis not present

## 2021-05-08 DIAGNOSIS — R0683 Snoring: Secondary | ICD-10-CM | POA: Diagnosis not present

## 2021-05-08 DIAGNOSIS — G479 Sleep disorder, unspecified: Secondary | ICD-10-CM | POA: Diagnosis not present

## 2021-05-20 DIAGNOSIS — H2513 Age-related nuclear cataract, bilateral: Secondary | ICD-10-CM | POA: Diagnosis not present

## 2021-06-03 ENCOUNTER — Other Ambulatory Visit: Payer: Self-pay | Admitting: Family Medicine

## 2021-06-03 ENCOUNTER — Other Ambulatory Visit: Payer: Self-pay | Admitting: Nurse Practitioner

## 2021-06-03 DIAGNOSIS — E782 Mixed hyperlipidemia: Secondary | ICD-10-CM

## 2021-06-03 MED ORDER — ROSUVASTATIN CALCIUM 5 MG PO TABS: 5.0000 mg | ORAL_TABLET | Freq: Every day | ORAL | 3 refills | Status: DC

## 2021-06-12 DIAGNOSIS — G4733 Obstructive sleep apnea (adult) (pediatric): Secondary | ICD-10-CM | POA: Diagnosis not present

## 2021-06-24 DIAGNOSIS — I4891 Unspecified atrial fibrillation: Secondary | ICD-10-CM | POA: Diagnosis not present

## 2021-06-24 DIAGNOSIS — G4733 Obstructive sleep apnea (adult) (pediatric): Secondary | ICD-10-CM | POA: Diagnosis not present

## 2021-07-17 DIAGNOSIS — S93401A Sprain of unspecified ligament of right ankle, initial encounter: Secondary | ICD-10-CM | POA: Diagnosis not present

## 2021-07-23 ENCOUNTER — Telehealth: Payer: Self-pay

## 2021-07-23 ENCOUNTER — Encounter: Payer: Self-pay | Admitting: Family Medicine

## 2021-07-23 ENCOUNTER — Ambulatory Visit (INDEPENDENT_AMBULATORY_CARE_PROVIDER_SITE_OTHER): Payer: Medicare HMO | Admitting: Family Medicine

## 2021-07-23 VITALS — BP 122/74 | HR 77 | Temp 97.7°F | Resp 16 | Ht 63.0 in | Wt 188.2 lb

## 2021-07-23 DIAGNOSIS — E782 Mixed hyperlipidemia: Secondary | ICD-10-CM

## 2021-07-23 DIAGNOSIS — M25571 Pain in right ankle and joints of right foot: Secondary | ICD-10-CM | POA: Diagnosis not present

## 2021-07-23 DIAGNOSIS — I4891 Unspecified atrial fibrillation: Secondary | ICD-10-CM

## 2021-07-23 DIAGNOSIS — Z5181 Encounter for therapeutic drug level monitoring: Secondary | ICD-10-CM | POA: Diagnosis not present

## 2021-07-23 DIAGNOSIS — Z7901 Long term (current) use of anticoagulants: Secondary | ICD-10-CM

## 2021-07-23 LAB — COMPLETE METABOLIC PANEL WITH GFR
AG Ratio: 2.3 (calc) (ref 1.0–2.5)
ALT: 14 U/L (ref 6–29)
AST: 14 U/L (ref 10–35)
Albumin: 4.2 g/dL (ref 3.6–5.1)
Alkaline phosphatase (APISO): 57 U/L (ref 37–153)
BUN: 16 mg/dL (ref 7–25)
CO2: 28 mmol/L (ref 20–32)
Calcium: 9 mg/dL (ref 8.6–10.4)
Chloride: 106 mmol/L (ref 98–110)
Creat: 0.75 mg/dL (ref 0.50–1.05)
Globulin: 1.8 g/dL (calc) — ABNORMAL LOW (ref 1.9–3.7)
Glucose, Bld: 89 mg/dL (ref 65–99)
Potassium: 4.3 mmol/L (ref 3.5–5.3)
Sodium: 143 mmol/L (ref 135–146)
Total Bilirubin: 0.4 mg/dL (ref 0.2–1.2)
Total Protein: 6 g/dL — ABNORMAL LOW (ref 6.1–8.1)
eGFR: 86 mL/min/{1.73_m2} (ref >=60)

## 2021-07-23 LAB — CBC WITH DIFFERENTIAL/PLATELET
Absolute Monocytes: 462 cells/uL (ref 200–950)
Basophils Absolute: 51 cells/uL (ref 0–200)
Basophils Relative: 0.9 %
Eosinophils Absolute: 171 cells/uL (ref 15–500)
Eosinophils Relative: 3 %
HCT: 43.5 % (ref 35.0–45.0)
Hemoglobin: 13.9 g/dL (ref 11.7–15.5)
Lymphs Abs: 1379 cells/uL (ref 850–3900)
MCH: 29.6 pg (ref 27.0–33.0)
MCHC: 32 g/dL (ref 32.0–36.0)
MCV: 92.8 fL (ref 80.0–100.0)
MPV: 10.2 fL (ref 7.5–12.5)
Monocytes Relative: 8.1 %
Neutro Abs: 3637 cells/uL (ref 1500–7800)
Neutrophils Relative %: 63.8 %
Platelets: 241 10*3/uL (ref 140–400)
RBC: 4.69 10*6/uL (ref 3.80–5.10)
RDW: 12 % (ref 11.0–15.0)
Total Lymphocyte: 24.2 %
WBC: 5.7 10*3/uL (ref 3.8–10.8)

## 2021-07-23 MED ORDER — MELOXICAM 7.5 MG PO TABS: 7.5000 mg | ORAL_TABLET | Freq: Every day | ORAL | 0 refills | Status: DC | PRN

## 2021-07-23 NOTE — Progress Notes (Signed)
? ?Name: Laura Oneill   MRN: [id number]    DOB: [birth year]   Date:07/23/2021 ? ?     Progress Note ? ?Chief Complaint  ?Patient presents with  ? Follow-up  ? Ankle Pain  ?  Right, twisted in yard  ? ? ? ?Subjective:  ? ?Laura Oneill is a 71 y.o. female, presents to clinic for f/up on right ankle pain and other chronic conditions ? ?Pt ankle pain/injury is already being managed by [facility] ? ?A fib - paroxymal - every once in a while pt has palpitations ?On metoprolol and eliquis ?BP Readings from Last 3 Encounters:  ?07/23/21 122/74  ?01/22/21 124/72  ?09/18/20 118/82  ? ?Pulse Readings from Last 3 Encounters:  ?07/23/21 77  ?01/22/21 62  ?09/18/20 97  ? ?Pt denies CP, SOB, exertional sx, LE edema, Ha's, visual disturbances, lightheadedness, hypotension, syncope. ? ? ? ? ? ?Current Outpatient Medications:  ?  apixaban (ELIQUIS) 5 MG TABS tablet, Take by mouth., Disp: , Rfl:  ?  Calcium Carb-Cholecalciferol (CALCIUM 600+D3 PO), Take 1 tablet by mouth daily., Disp: , Rfl:  ?  ELIQUIS 5 MG TABS tablet, SMARTSIG:1 Tablet(s) By Mouth Every 12 Hours, Disp: , Rfl:  ?  Glucosamine-Chondroitin (OSTEO BI-FLEX REGULAR STRENGTH PO), Take 2 tablets by mouth daily., Disp: , Rfl:  ?  meloxicam (MOBIC) 7.5 MG tablet, Take 1 tablet (7.5 mg total) by mouth daily as needed for pain., Disp: 30 tablet, Rfl: 3 ?  metoprolol succinate (TOPROL-XL) 25 MG 24 hr tablet, Take 1 tablet by mouth daily., Disp: , Rfl:  ?  Multiple Vitamin (MULTIVITAMIN WITH MINERALS) TABS tablet, Take 1 tablet by mouth daily. Adults 50+, Disp: , Rfl:  ?  Multiple Vitamins-Minerals (PRESERVISION AREDS 2 PO), Take 1 tablet by mouth in the morning and at bedtime. , Disp: , Rfl:  ?  Multiple Vitamins-Minerals (PRESERVISION AREDS 2) CAPS, , Disp: , Rfl:  ?  Omega-3 Fatty Acids (FISH OIL) 1000 MG CAPS, Take 1,000 mg by mouth daily. , Disp: , Rfl:  ?  rosuvastatin (CRESTOR) 5 MG tablet, Take 1 tablet (5 mg total) by mouth at bedtime., Disp: 90 tablet, Rfl:  3 ? ?Patient Active Problem List  ? Diagnosis Date Noted  ? New onset atrial fibrillation ([place]) 11/03/2020  ? Snoring 11/03/2020  ? Osteoarthritis of knee 03/19/2020  ? Epigastric hernia   ? Torus fracture of distal end of right fibula 01/18/2018  ? Obesity (BMI 30.0-34.9) 12/22/2017  ? Serrated polyp of colon 12/22/2017  ? Osteopenia 11/04/2017  ? CKD (chronic kidney disease) stage 2, GFR 60-89 ml/min 09/05/2017  ? Hyperlipidemia 08/25/2016  ? Post-menopausal 06/24/2015  ? ? ?Past Surgical History:  ?Procedure Laterality Date  ? COLONOSCOPY WITH PROPOFOL N/A 09/15/2017  ? Procedure: COLONOSCOPY WITH PROPOFOL;  Surgeon: Jonathon Bellows, MD;  Location: [facility] ENDOSCOPY;  Service: Gastroenterology;  Laterality: N/A;  ? COLONOSCOPY WITH PROPOFOL N/A 09/17/2020  ? Procedure: COLONOSCOPY WITH PROPOFOL;  Surgeon: Jonathon Bellows, MD;  Location: [facility] ENDOSCOPY;  Service: Gastroenterology;  Laterality: N/A;  ? EPIGASTRIC HERNIA REPAIR N/A 10/05/2019  ? Procedure: HERNIA REPAIR EPIGASTRIC ADULT, open;  Surgeon: Fredirick Maudlin, MD;  Location: [facility] ORS;  Service: General;  Laterality: N/A;  ? ? ?Family History  ?Problem Relation Age of Onset  ? Hypothyroidism Mother   ? Dementia Mother   ? COPD Father   ? Breast cancer Neg Hx   ? ? ?Social History  ? ?Tobacco Use  ? Smoking status: Never  ?  Passive exposure: Never  ? Smokeless tobacco: Never  ? Tobacco comments:  ?  Smoking cessation materials not required  ?Vaping Use  ? Vaping Use: Never used  ?Substance Use Topics  ? Alcohol use: Yes  ?  Alcohol/week: 0.0 standard drinks  ?  Comment: occasional  ? Drug use: No  ?  ? ?Allergies  ?Allergen Reactions  ? Latex Rash  ? ? ?Health Maintenance  ?Topic Date Due  ? COVID-19 Vaccine (5 - Booster for Moderna series) 08/08/2021 (Originally 09/15/2020)  ? INFLUENZA VACCINE  11/03/2021  ? DEXA SCAN  11/11/2021  ? MAMMOGRAM  11/12/2021  ? TETANUS/TDAP  01/27/2023  ? COLONOSCOPY (Pts 45-40yr Insurance coverage will need to be confirmed)  09/18/2023  ?  Pneumonia Vaccine 70 Years old  Completed  ? Hepatitis C Screening  Completed  ? Zoster Vaccines- Shingrix  Completed  ? HPV VACCINES  Aged Out  ? ? ?Chart Review Today: ?I personally reviewed active problem list, medication list, allergies, family history, social history, health maintenance, notes from last encounter, lab results, imaging with the patient/caregiver today. ? ? ?Review of Systems  ?Constitutional: Negative.   ?HENT: Negative.    ?Eyes: Negative.   ?Respiratory: Negative.    ?Cardiovascular: Negative.   ?Gastrointestinal: Negative.   ?Endocrine: Negative.   ?Genitourinary: Negative.   ?Musculoskeletal: Negative.   ?Skin: Negative.   ?Allergic/Immunologic: Negative.   ?Neurological: Negative.   ?Hematological: Negative.   ?Psychiatric/Behavioral: Negative.    ?All other systems reviewed and are negative.  ? ?Objective:  ? ?Vitals:  ? 07/23/21 0823  ?BP: 122/74  ?Pulse: 77  ?Resp: 16  ?Temp: 97.7 ?F (36.5 ?C)  ?TempSrc: Oral  ?SpO2: 96%  ?Weight: 188 lb 3.2 oz (85.4 kg)  ?Height: '5\' 3"'$  (1.6 m)  ? ? ?Body mass index is 33.34 kg/m?. ? ?Physical Exam ?Vitals and nursing note reviewed.  ?Constitutional:   ?   General: She is not in acute distress. ?   Appearance: Normal appearance. She is normal weight. She is not ill-appearing, toxic-appearing or diaphoretic.  ?HENT:  ?   Head: Normocephalic and atraumatic.  ?   Right Ear: External ear normal.  ?   Left Ear: External ear normal.  ?Eyes:  ?   General: No scleral icterus.    ?   Right eye: No discharge.     ?   Left eye: No discharge.  ?   Conjunctiva/sclera: Conjunctivae normal.  ?Cardiovascular:  ?   Rate and Rhythm: Normal rate and regular rhythm.  ?   Pulses: Normal pulses.  ?   Heart sounds: Normal heart sounds. No murmur heard. ?  No friction rub. No gallop.  ?Pulmonary:  ?   Effort: Pulmonary effort is normal. No respiratory distress.  ?   Breath sounds: Normal breath sounds. No stridor. No wheezing or rales.  ?Abdominal:  ?   General: Bowel sounds  are normal.  ?   Palpations: Abdomen is soft.  ?Musculoskeletal:  ?   Right lower leg: No edema.  ?   Left lower leg: No edema.  ?Skin: ?   General: Skin is warm and dry.  ?   Coloration: Skin is not jaundiced or pale.  ?   Findings: No bruising.  ?Neurological:  ?   Mental Status: She is alert. Mental status is at baseline.  ?Psychiatric:     ?   Mood and Affect: Mood normal.  ?  ? ? ? ? ?Assessment & Plan:  ? ?1.  Mixed hyperlipidemia ?On crestor 5 mg daily, good compliance, tolerating w/o concerning SE or myalgias ?Reviewed last lipids, at goal, will be due in 6 months, continue statin, monitoring LFTs ?- COMPLETE METABOLIC PANEL WITH GFR ? ?2. Atrial fibrillation, unspecified type ([place]) ?managed by cardiology, onset with colonoscopy, monitor captured 70% of the time in afib, s/p cardioversion, RRR today, on eliquis ?- COMPLETE METABOLIC PANEL WITH GFR ?- CBC with Differential/Platelet ? ?3. Encounter for medication monitoring ? ?- COMPLETE METABOLIC PANEL WITH GFR ?- CBC with Differential/Platelet ? ?4. Encounter for current long-term use of anticoagulants ?Monitoring renal function and blood counts ?- COMPLETE METABOLIC PANEL WITH GFR ?- CBC with Differential/Platelet ? ?5. Right ankle pain, unspecified chronicity ?per ortho - reviewed pain/inflammation med options, can use mobic very sparingly, urged tylenol, ice/rest/compression/elevation first, bleed risk reviewed ?- meloxicam (MOBIC) 7.5 MG tablet; Take 1 tablet (7.5 mg total) by mouth daily as needed for pain (sparingly for joint pain/swelling).  Dispense: 30 tablet; Refill: 0 ? ? ?No follow-ups on file.  ? ?Delsa Grana, PA-C ?07/23/21 8:43 AM ? ? ?

## 2021-07-23 NOTE — Telephone Encounter (Signed)
Pt scheduled her 72mappt but would like to know if she need to come fasting that day. [facility]

## 2021-07-27 NOTE — Telephone Encounter (Signed)
Patient notified

## 2021-09-22 ENCOUNTER — Ambulatory Visit: Payer: Medicare HMO

## 2021-09-29 ENCOUNTER — Ambulatory Visit (INDEPENDENT_AMBULATORY_CARE_PROVIDER_SITE_OTHER): Payer: Medicare HMO | Admitting: Physician Assistant

## 2021-09-29 ENCOUNTER — Encounter: Payer: Self-pay | Admitting: Physician Assistant

## 2021-09-29 VITALS — BP 118/70 | HR 63 | Temp 97.9°F | Resp 16 | Ht 63.0 in | Wt 185.3 lb

## 2021-09-29 DIAGNOSIS — Z1231 Encounter for screening mammogram for malignant neoplasm of breast: Secondary | ICD-10-CM

## 2021-09-29 DIAGNOSIS — M85859 Other specified disorders of bone density and structure, unspecified thigh: Secondary | ICD-10-CM | POA: Diagnosis not present

## 2021-09-29 DIAGNOSIS — Z0000 Encounter for general adult medical examination without abnormal findings: Secondary | ICD-10-CM | POA: Diagnosis not present

## 2021-11-04 DIAGNOSIS — I4891 Unspecified atrial fibrillation: Secondary | ICD-10-CM | POA: Diagnosis not present

## 2021-12-24 ENCOUNTER — Ambulatory Visit
Admission: RE | Admit: 2021-12-24 | Discharge: 2021-12-24 | Disposition: A | Discharge status: Home / Self Care | Payer: Medicare HMO | Source: Ambulatory Visit | Attending: Physician Assistant | Admitting: Physician Assistant

## 2021-12-24 DIAGNOSIS — Z13820 Encounter for screening for osteoporosis: Secondary | ICD-10-CM | POA: Diagnosis not present

## 2021-12-24 DIAGNOSIS — M85859 Other specified disorders of bone density and structure, unspecified thigh: Secondary | ICD-10-CM | POA: Insufficient documentation

## 2021-12-24 DIAGNOSIS — Z780 Asymptomatic menopausal state: Secondary | ICD-10-CM | POA: Insufficient documentation

## 2021-12-24 DIAGNOSIS — Z1231 Encounter for screening mammogram for malignant neoplasm of breast: Secondary | ICD-10-CM | POA: Insufficient documentation

## 2021-12-24 DIAGNOSIS — M85852 Other specified disorders of bone density and structure, left thigh: Secondary | ICD-10-CM | POA: Diagnosis not present

## 2021-12-24 DIAGNOSIS — Z0000 Encounter for general adult medical examination without abnormal findings: Secondary | ICD-10-CM | POA: Insufficient documentation

## 2021-12-25 NOTE — Progress Notes (Signed)
Per the results of her DEXA scan she is considered osteopenic. She should optimize her Vitamin D and Calcium intake if not already doing so.

## 2022-01-26 ENCOUNTER — Encounter: Payer: Self-pay | Admitting: Family Medicine

## 2022-01-26 ENCOUNTER — Ambulatory Visit (INDEPENDENT_AMBULATORY_CARE_PROVIDER_SITE_OTHER): Payer: Medicare HMO | Admitting: Family Medicine

## 2022-01-26 VITALS — BP 116/74 | HR 90 | Temp 98.3°F | Resp 16 | Ht 63.0 in | Wt 185.5 lb

## 2022-01-26 DIAGNOSIS — M8589 Other specified disorders of bone density and structure, multiple sites: Secondary | ICD-10-CM | POA: Diagnosis not present

## 2022-01-26 DIAGNOSIS — I4891 Unspecified atrial fibrillation: Secondary | ICD-10-CM

## 2022-01-26 DIAGNOSIS — Z7901 Long term (current) use of anticoagulants: Secondary | ICD-10-CM

## 2022-01-26 DIAGNOSIS — E782 Mixed hyperlipidemia: Secondary | ICD-10-CM

## 2022-01-26 DIAGNOSIS — M899 Disorder of bone, unspecified: Secondary | ICD-10-CM | POA: Diagnosis not present

## 2022-01-26 DIAGNOSIS — G4733 Obstructive sleep apnea (adult) (pediatric): Secondary | ICD-10-CM | POA: Diagnosis not present

## 2022-01-26 DIAGNOSIS — E669 Obesity, unspecified: Secondary | ICD-10-CM

## 2022-01-26 DIAGNOSIS — Z01419 Encounter for gynecological examination (general) (routine) without abnormal findings: Secondary | ICD-10-CM | POA: Diagnosis not present

## 2022-01-26 DIAGNOSIS — Z5181 Encounter for therapeutic drug level monitoring: Secondary | ICD-10-CM

## 2022-01-26 DIAGNOSIS — Z9189 Other specified personal risk factors, not elsewhere classified: Secondary | ICD-10-CM | POA: Insufficient documentation

## 2022-01-26 DIAGNOSIS — Z23 Encounter for immunization: Secondary | ICD-10-CM

## 2022-01-26 DIAGNOSIS — Z0000 Encounter for general adult medical examination without abnormal findings: Secondary | ICD-10-CM | POA: Diagnosis not present

## 2022-01-26 NOTE — Assessment & Plan Note (Signed)
Per [facility] - Dr. Raul Del eval in March 2023 insurance will not approve CPAP due to AHI only being 11 She was to work on weight loss

## 2022-01-26 NOTE — Assessment & Plan Note (Signed)
See osteopenia and HPI Frax score increased and T-score both qualify for medical therapies Likely will start fosamax 70 mg weekly, reviewed MOA, SE, benefits and that meds can be taken for 3-5 years, she would like to start now to prevent possible fx, sig family hx Fall prevention discussed

## 2022-01-26 NOTE — Assessment & Plan Note (Signed)
Currently in aFib, not having palpitations, rate controlled with toprol XL 25 mg qd, only mild intermittent fatigue, no exertional sx - but she admits she is rarely exercising or doing strenuous activity Anticoagulated and rate controlled with Dr. Clayborn Bigness

## 2022-01-26 NOTE — Assessment & Plan Note (Signed)
Stable, well controlled with crestor 5 mg Labs last year improved, no med changes, she endorses good compliance and no SE or concerns It is time for annual lipid recheck

## 2022-01-26 NOTE — Assessment & Plan Note (Signed)
Osteopenia - recent dexa showed no sig change to t-score but sig increase to Frax %, not on bisphosphonates - strong family hx of osteoporosis and hip fx Reviewed last two dexa today in office with pt Pt is interested in starting fosamax - will check labs, adjust and optimize Vit D/calcium supplementation as needed and then start fosamax - reviewed MOA and possible SE and benefits If bothersome SE explained she can do daily lower dosing or consult with specialists for infusion Discussed avoiding falls and f/up monitoring of dexa in 2 years

## 2022-01-26 NOTE — Progress Notes (Signed)
Name: Laura Oneill   MRN: [id number]    DOB: [birth year]   Date:01/26/2022       Progress Note  Chief Complaint  Patient presents with   Follow-up   Hyperlipidemia     Subjective:   Laura Oneill is a 71 y.o. female, presents to clinic for routine f/up  Hyperlipidemia: Currently treated with crestor 5, pt reports good med compliance Last Lipids: Lab Results  Component Value Date   CHOL 158 01/22/2021   HDL 59 01/22/2021   LDLCALC 84 01/22/2021   TRIG 73 01/22/2021   CHOLHDL 2.7 01/22/2021   - Denies: Chest pain, shortness of breath, myalgias, claudication  Afib on metoprolol and anticoagulated, onset within the past year, seeing cardiology HR and BP normal today, she does not feel palpitations but is in afib Pulse Readings from Last 3 Encounters:  01/26/22 90  09/29/21 63  07/23/21 77   BP Readings from Last 3 Encounters:  01/26/22 116/74  09/29/21 118/70  07/23/21 122/74   No bleeding or bruising concerns Pt denies LE edema, near syncope, CP, SOB, she does feel tired, esp in the afternoons, she is not exercising but they have 2 acres and she is often walking all over her property w/o concerning sx.   Recent Cardiology and pulm OV reviewed Dr. Clayborn Bigness 11/04/2021: Assessment   71 y.o. female with  1. Atrial fibrillation, unspecified type (CMS-HCC)  2 Obesity 3 OSA 3 Hyperlipidemia 4 Palpitations  Plan  1 paroxysmal a-fib, recommend continuing Eliquis and metoprolol 2 OSA, recommend CPAP if indicated and weight loss 3 obesity, recommend exercise, healthy diet, and weight loss 4 Tachycardia, heart rate today 62, recommend continuing metoprolol therapy with for symptom control 5 HLD, recommend continuing Crestor therapy for lipid management 6 have the patient follow-up in 6 months  Return in about 6 months (around 05/07/2022).  Osteopenia - recent dexa showed no sig change to t-score but sig increase to Frax %, not on bisphosphonates - strong family hx  of osteoporosis and hip fx Reviewed last two dexa today in office with pt  Dexa 12/24/2021: Your patient Laura Oneill completed a FRAX assessment on 12/24/2021 using the [place] (analysis version: 14.10) manufactured by [employer]. The following summarizes the results of our evaluation.   PATIENT BIOGRAPHICAL: Name: Laura Oneill, Laura Oneill Patient ID: [id number] Birth Date: 1950/07/03 Height:    63.0 in. Gender:     Female    [age]       Weight:    185.3 lbs. Ethnicity:  White                            Exam Date: 12/24/2021   FRAX* RESULTS:  (version: 3.5) 10-year Probability of Fracture1 Major Osteoporotic Fracture2 Hip Fracture 27.3% 6.3% Population: [place] (Caucasian) Risk Factors: History of Fracture (Adult), Family Hist. (Parent hip fracture)   Based on Femur (Left) Neck BMD   1 -The 10-year probability of fracture may be lower than reported if the patient has received treatment. 2 -Major Osteoporotic Fracture: Clinical Spine, Forearm, Hip or Shoulder   *FRAX is a Materials engineer of the [employer] for Metabolic Bone Disease, a [place] (WHO) [facility].   ASSESSMENT: The probability of a major osteoporotic fracture is 27.3% within the next ten years.   The probability of a hip fracture is 6.3% within the  next ten years.   Electronically Signed   By: Zerita Boers M.D.   On: 12/24/2021 09:27  RECOMMENDATIONS: 1. All patients should optimize calcium and vitamin D intake. 2. Consider FDA-approved medical therapies in postmenopausal women and men aged 45 years and older, based on the following: a. A hip or vertebral(clinical or morphometric) fracture b. T-score < -2.5 at the femoral neck or spine after appropriate evaluation to exclude secondary causes c. Low bone mass (T-score between -1.0 and -2.5 at the femoral neck or spine) and a 10-year probability of a hip fracture > 3% or a  10-year probability of a major osteoporosis-related fracture > 20% based on the [place]-adapted WHO algorithm 3. Clinician judgment and/or patient preferences may indicate treatment for people with 10-year fracture probabilities above or below these levels  FOLLOW-UP: People with diagnosed cases of osteoporosis or at high risk for fracture should have regular bone mineral density tests. For patients eligible for Medicare, routine testing is allowed once every 2 years. The testing frequency can be increased to one year for patients who have rapidly progressing disease, those who are receiving or discontinuing medical therapy to restore bone mass, or have additional risk factors. I have reviewed this report, and agree with the above findings.   [facility]  She is taking a multivitamin with Vit D and calcium, Vit D last checked 3+ years ago and was normal   Last years Frax score was less than 20/3% for major osteoporotic fx/hip fx respectively   Current Outpatient Medications:    apixaban (ELIQUIS) 5 MG TABS tablet, Take by mouth., Disp: , Rfl:    Calcium Carb-Cholecalciferol (CALCIUM 600+D3 PO), Take 1 tablet by mouth daily., Disp: , Rfl:    ELIQUIS 5 MG TABS tablet, SMARTSIG:1 Tablet(s) By Mouth Every 12 Hours, Disp: , Rfl:    metoprolol succinate (TOPROL-XL) 25 MG 24 hr tablet, Take 1 tablet by mouth daily., Disp: , Rfl:    Multiple Vitamin (MULTIVITAMIN WITH MINERALS) TABS tablet, Take 1 tablet by mouth daily. Adults 50+, Disp: , Rfl:    Multiple Vitamins-Minerals (PRESERVISION AREDS 2 PO), Take 1 tablet by mouth in the morning and at bedtime. , Disp: , Rfl:    Multiple Vitamins-Minerals (PRESERVISION AREDS 2) CAPS, , Disp: , Rfl:    Omega-3 Fatty Acids (FISH OIL) 1000 MG CAPS, Take 1,000 mg by mouth daily. , Disp: , Rfl:    rosuvastatin (CRESTOR) 5 MG tablet, Take 1 tablet (5 mg total) by mouth at bedtime., Disp: 90 tablet, Rfl: 3   Glucosamine-Chondroitin (OSTEO BI-FLEX  REGULAR STRENGTH PO), Take 2 tablets by mouth daily., Disp: , Rfl:   Patient Active Problem List   Diagnosis Date Noted   New onset atrial fibrillation ([place]) 11/03/2020   Snoring 11/03/2020   Osteoarthritis of knee 03/19/2020   Epigastric hernia    Torus fracture of distal end of right fibula 01/18/2018   Obesity (BMI 30.0-34.9) 12/22/2017   Serrated polyp of colon 12/22/2017   Osteopenia 11/04/2017   CKD (chronic kidney disease) stage 2, GFR 60-89 ml/min 09/05/2017   Hyperlipidemia 08/25/2016   Post-menopausal 06/24/2015    Past Surgical History:  Procedure Laterality Date   COLONOSCOPY WITH PROPOFOL N/A 09/15/2017   Procedure: COLONOSCOPY WITH PROPOFOL;  Surgeon: Jonathon Bellows, MD;  Location[facility] ENDOSCOPY;  Service: Gastroenterology;  Laterality: N/A;   COLONOSCOPY WITH PROPOFOL N/A 09/17/2020   Procedure: COLONOSCOPY WITH PROPOFOL;  Surgeon: Jonathon Bellows, MD;  [facility] ENDOSCOPY;  Service: Gastroenterology;  Laterality:  N/A;   EPIGASTRIC HERNIA REPAIR N/A 10/05/2019   Procedure: HERNIA REPAIR EPIGASTRIC ADULT, open;  Surgeon: Fredirick Maudlin, MD;  Location: [facility] ORS;  Service: General;  Laterality: N/A;    Family History  Problem Relation Age of Onset   Hypothyroidism Mother    Dementia Mother    COPD Father    Breast cancer Neg Hx     Social History   Tobacco Use   Smoking status: Never    Passive exposure: Never   Smokeless tobacco: Never   Tobacco comments:    Smoking cessation materials not required  Vaping Use   Vaping Use: Never used  Substance Use Topics   Alcohol use: Yes    Alcohol/week: 0.0 standard drinks of alcohol    Comment: occasional   Drug use: No     Allergies  Allergen Reactions   Latex Rash    Health Maintenance  Topic Date Due   COVID-19 Vaccine (6 - Moderna risk series) 02/11/2022 (Originally 03/06/2021)   INFLUENZA VACCINE  07/04/2022 (Originally 11/03/2021)   Medicare Annual Wellness (AWV)  10/30/2022   MAMMOGRAM  12/25/2022    TETANUS/TDAP  01/27/2023   COLONOSCOPY (Pts 45-26yr Insurance coverage will need to be confirmed)  09/18/2023   DEXA SCAN  12/25/2023   Pneumonia Vaccine 70 Years old  Completed   Hepatitis C Screening  Completed   Zoster Vaccines- Shingrix  Completed   HPV VACCINES  Aged Out    Chart Review Today: I personally reviewed active problem list, medication list, allergies, family history, social history, health maintenance, notes from last encounter, lab results, imaging with the patient/caregiver today.   Review of Systems  Constitutional: Negative.   HENT: Negative.    Eyes: Negative.   Respiratory: Negative.    Cardiovascular: Negative.   Gastrointestinal: Negative.   Endocrine: Negative.   Genitourinary: Negative.   Musculoskeletal: Negative.   Skin: Negative.   Allergic/Immunologic: Negative.   Neurological: Negative.   Hematological: Negative.   Psychiatric/Behavioral: Negative.    All other systems reviewed and are negative.    Objective:   Vitals:   01/26/22 0841  BP: 116/74  Pulse: 90  Resp: 16  Temp: 98.3 F (36.8 C)  TempSrc: Oral  SpO2: 95%  Weight: 185 lb 8 oz (84.1 kg)  Height: '5\' 3"'$  (1.6 m)    Body mass index is 32.86 kg/m.  Physical Exam Vitals and nursing note reviewed.  Constitutional:      General: She is not in acute distress.    Appearance: Normal appearance. She is well-developed and well-groomed. She is obese. She is not ill-appearing, toxic-appearing or diaphoretic.  HENT:     Head: Normocephalic and atraumatic.     Right Ear: External ear normal.     Left Ear: External ear normal.     Nose: Nose normal.  Eyes:     General:        Right eye: No discharge.        Left eye: No discharge.     Conjunctiva/sclera: Conjunctivae normal.  Neck:     Trachea: No tracheal deviation.  Cardiovascular:     Rate and Rhythm: Normal rate. Rhythm irregularly irregular.     Chest Wall: PMI is not displaced.     Pulses:          Radial pulses are  1+ on the right side and 1+ on the left side.     Heart sounds: Normal heart sounds. No murmur heard.    No  friction rub. No gallop.  Pulmonary:     Effort: Pulmonary effort is normal. No respiratory distress.     Breath sounds: Normal breath sounds. No stridor. No wheezing, rhonchi or rales.  Musculoskeletal:        General: Normal range of motion.  Skin:    General: Skin is warm and dry.     Coloration: Skin is not jaundiced or pale.     Findings: No bruising or rash.  Neurological:     Mental Status: She is alert. Mental status is at baseline.     Motor: No abnormal muscle tone.     Coordination: Coordination normal.  Psychiatric:        Mood and Affect: Mood normal.        Behavior: Behavior normal. Behavior is cooperative.         Assessment & Plan:   Problem List Items Addressed This Visit       Cardiovascular and Mediastinum   Atrial fibrillation (HCC)    Currently in aFib, not having palpitations, rate controlled with toprol XL 25 mg qd, only mild intermittent fatigue, no exertional sx - but she admits she is rarely exercising or doing strenuous activity Anticoagulated and rate controlled with Dr. Clayborn Bigness      Relevant Orders   COMPLETE METABOLIC PANEL WITH GFR     Respiratory   OSA (obstructive sleep apnea)    Per [facility] pul[facility] - Dr. Raul Del eval in March 2023 insurance will not approve CPAP due to AHI only being 11 She was to work on weight loss         Musculoskeletal and Integument   Osteopenia    Osteopenia - recent dexa showed no sig change to t-score but sig increase to Frax %, not on bisphosphonates - strong family hx of osteoporosis and hip fx Reviewed last two dexa today in office with pt Pt is interested in starting fosamax - will check labs, adjust and optimize Vit D/calcium supplementation as needed and then start fosamax - reviewed MOA and possible SE and benefits If bothersome SE explained she can do daily lower dosing or consult with  specialists for infusion Discussed avoiding falls and f/up monitoring of dexa in 2 years      Relevant Orders   COMPLETE METABOLIC PANEL WITH GFR   Vitamin D 1,25 dihydroxy     Other   Hyperlipidemia - Primary    Stable, well controlled with crestor 5 mg Labs last year improved, no med changes, she endorses good compliance and no SE or concerns It is time for annual lipid recheck      Relevant Orders   COMPLETE METABOLIC PANEL WITH GFR   Lipid panel   Vitamin D 1,25 dihydroxy   Obesity (BMI 30.0-34.9)    Associated OSA, afib, HLD, OA      Relevant Orders   Vitamin D 1,25 dihydroxy   Fracture Risk Assessment Score (FRAX) indicating greater than 3% risk for hip fracture    See osteopenia and HPI Frax score increased and T-score both qualify for medical therapies Likely will start fosamax 70 mg weekly, reviewed MOA, SE, benefits and that meds can be taken for 3-5 years, she would like to start now to prevent possible fx, sig family hx Fall prevention discussed       Relevant Orders   COMPLETE METABOLIC PANEL WITH GFR   Vitamin D 1,25 dihydroxy   Other Visit Diagnoses     Need for influenza vaccination  refused today   Bone disease       Relevant Orders   COMPLETE METABOLIC PANEL WITH GFR   Vitamin D 1,25 dihydroxy   Encounter for current long-term use of anticoagulants       she denies bleeding, easier bruising than in the past, last CBC and H/H reviewed today   Relevant Orders   COMPLETE METABOLIC PANEL WITH GFR   Lipid panel   Vitamin D 1,25 dihydroxy   Encounter for medication monitoring       Relevant Orders   COMPLETE METABOLIC PANEL WITH GFR   Lipid panel   Vitamin D 1,25 dihydroxy        Return in about 6 months (around 07/28/2022) for Routine follow-up.   Delsa Grana, PA-C 01/26/22 8:58 AM

## 2022-01-26 NOTE — Assessment & Plan Note (Signed)
Associated OSA, afib, HLD, OA

## 2022-01-29 DIAGNOSIS — Z7901 Long term (current) use of anticoagulants: Secondary | ICD-10-CM | POA: Diagnosis not present

## 2022-01-29 DIAGNOSIS — E669 Obesity, unspecified: Secondary | ICD-10-CM | POA: Diagnosis not present

## 2022-01-29 DIAGNOSIS — D6869 Other thrombophilia: Secondary | ICD-10-CM | POA: Diagnosis not present

## 2022-01-29 DIAGNOSIS — Z6832 Body mass index (BMI) 32.0-32.9, adult: Secondary | ICD-10-CM | POA: Diagnosis not present

## 2022-01-29 DIAGNOSIS — E785 Hyperlipidemia, unspecified: Secondary | ICD-10-CM | POA: Diagnosis not present

## 2022-01-29 DIAGNOSIS — M1990 Unspecified osteoarthritis, unspecified site: Secondary | ICD-10-CM | POA: Diagnosis not present

## 2022-01-29 DIAGNOSIS — I10 Essential (primary) hypertension: Secondary | ICD-10-CM | POA: Diagnosis not present

## 2022-01-29 DIAGNOSIS — Z008 Encounter for other general examination: Secondary | ICD-10-CM | POA: Diagnosis not present

## 2022-01-29 DIAGNOSIS — I739 Peripheral vascular disease, unspecified: Secondary | ICD-10-CM | POA: Diagnosis not present

## 2022-01-29 DIAGNOSIS — K219 Gastro-esophageal reflux disease without esophagitis: Secondary | ICD-10-CM | POA: Diagnosis not present

## 2022-01-29 DIAGNOSIS — M8580 Other specified disorders of bone density and structure, unspecified site: Secondary | ICD-10-CM | POA: Diagnosis not present

## 2022-01-29 DIAGNOSIS — I4891 Unspecified atrial fibrillation: Secondary | ICD-10-CM | POA: Diagnosis not present

## 2022-01-29 DIAGNOSIS — G4733 Obstructive sleep apnea (adult) (pediatric): Secondary | ICD-10-CM | POA: Diagnosis not present

## 2022-01-30 LAB — COMPLETE METABOLIC PANEL WITH GFR
AG Ratio: 2.3 (calc) (ref 1.0–2.5)
ALT: 14 U/L (ref 6–29)
AST: 14 U/L (ref 10–35)
Albumin: 4.4 g/dL (ref 3.6–5.1)
Alkaline phosphatase (APISO): 60 U/L (ref 37–153)
BUN: 18 mg/dL (ref 7–25)
CO2: 28 mmol/L (ref 20–32)
Calcium: 9.7 mg/dL (ref 8.6–10.4)
Chloride: 105 mmol/L (ref 98–110)
Creat: 0.79 mg/dL (ref 0.50–1.05)
Globulin: 1.9 g/dL (calc) (ref 1.9–3.7)
Glucose, Bld: 91 mg/dL (ref 65–99)
Potassium: 4.4 mmol/L (ref 3.5–5.3)
Sodium: 139 mmol/L (ref 135–146)
Total Bilirubin: 0.5 mg/dL (ref 0.2–1.2)
Total Protein: 6.3 g/dL (ref 6.1–8.1)
eGFR: 81 mL/min/{1.73_m2} (ref >=60)

## 2022-01-30 LAB — LIPID PANEL
Cholesterol: 147 mg/dL (ref <200)
HDL: 56 mg/dL (ref >=50)
LDL Cholesterol (Calc): 74 mg/dL (calc)
Non-HDL Cholesterol (Calc): 91 mg/dL (calc) (ref <130)
Total CHOL/HDL Ratio: 2.6 (calc) (ref <5.0)
Triglycerides: 83 mg/dL (ref <150)

## 2022-01-30 LAB — VITAMIN D 1,25 DIHYDROXY
Vitamin D 1, 25 (OH)2 Total: 68 pg/mL (ref 18–72)
Vitamin D2 1, 25 (OH)2: <8 pg/mL
Vitamin D3 1, 25 (OH)2: 68 pg/mL

## 2022-02-02 ENCOUNTER — Other Ambulatory Visit: Payer: Self-pay | Admitting: Family Medicine

## 2022-02-02 DIAGNOSIS — Z9189 Other specified personal risk factors, not elsewhere classified: Secondary | ICD-10-CM

## 2022-02-02 DIAGNOSIS — M8589 Other specified disorders of bone density and structure, multiple sites: Secondary | ICD-10-CM

## 2022-02-02 DIAGNOSIS — M899 Disorder of bone, unspecified: Secondary | ICD-10-CM

## 2022-02-02 MED ORDER — ALENDRONATE SODIUM 70 MG PO TABS: 70.0000 mg | ORAL_TABLET | ORAL | 3 refills | Status: AC

## 2022-05-06 DIAGNOSIS — I4891 Unspecified atrial fibrillation: Secondary | ICD-10-CM | POA: Diagnosis not present

## 2022-05-06 DIAGNOSIS — G4733 Obstructive sleep apnea (adult) (pediatric): Secondary | ICD-10-CM | POA: Diagnosis not present

## 2022-05-06 DIAGNOSIS — E669 Obesity, unspecified: Secondary | ICD-10-CM | POA: Diagnosis not present

## 2022-05-06 DIAGNOSIS — R000 Tachycardia, unspecified: Secondary | ICD-10-CM | POA: Diagnosis not present

## 2022-05-06 DIAGNOSIS — E785 Hyperlipidemia, unspecified: Secondary | ICD-10-CM | POA: Diagnosis not present

## 2022-05-23 ENCOUNTER — Other Ambulatory Visit: Payer: Self-pay | Admitting: Nurse Practitioner

## 2022-05-23 DIAGNOSIS — E782 Mixed hyperlipidemia: Secondary | ICD-10-CM

## 2022-05-24 NOTE — Telephone Encounter (Signed)
Requested medications are due for refill today.  yes  Requested medications are on the active medications list.  yes  Last refill. 06/03/2021 #90 3 rf  Future visit scheduled.   no  Notes to clinic.  No PCP listed.    Requested Prescriptions  Pending Prescriptions Disp Refills   rosuvastatin (CRESTOR) 5 MG tablet [Pharmacy Med Name: Rosuvastatin Calcium 5 MG Oral Tablet] 90 tablet 0    Sig: TAKE 1 TABLET BY MOUTH AT BEDTIME     Cardiovascular:  Antilipid - Statins 2 Failed - 05/23/2022  7:51 AM      Failed - Lipid Panel in normal range within the last 12 months    Cholesterol, Total  Date Value Ref Range Status  06/27/2015 216 (H) 100 - 199 mg/dL Final   Cholesterol  Date Value Ref Range Status  01/26/2022 147 <200 mg/dL Final   LDL Cholesterol (Calc)  Date Value Ref Range Status  01/26/2022 74 mg/dL (calc) Final    Comment:    Reference range: <100 . Desirable range <100 mg/dL for primary prevention;   <70 mg/dL for patients with CHD or diabetic patients  with > or = 2 CHD risk factors. Marland Kitchen LDL-C is now calculated using the Martin-Hopkins  calculation, which is a validated novel method providing  better accuracy than the Friedewald equation in the  estimation of LDL-C.  Cresenciano Genre et al. Annamaria Helling. [id number]): 2061-2068  (http://education.[facility].com/faq/FAQ164)    HDL  Date Value Ref Range Status  01/26/2022 56 > OR = 50 mg/dL Final  06/27/2015 60 >39 mg/dL Final   Triglycerides  Date Value Ref Range Status  01/26/2022 83 <150 mg/dL Final         Passed - Cr in normal range and within 360 days    Creat  Date Value Ref Range Status  01/26/2022 0.79 0.50 - 1.05 mg/dL Final         Passed - Patient is not pregnant      Passed - Valid encounter within last 12 months    Recent Outpatient Visits           3 months ago Mixed hyperlipidemia   [facility] Delsa Grana, PA-C   7 months ago Encounter for [employer] annual wellness  exam   [facility] Mecum, Dani Gobble, PA-C   10 months ago Mixed hyperlipidemia   [facility] Delsa Grana, PA-C   1 year ago Mixed hyperlipidemia   [facility] Serafina Royals F, FNP   1 year ago Obesity (BMI 30.0-34.9)   [facility] Just, Laurita Quint, FNP

## 2022-05-25 DIAGNOSIS — H2513 Age-related nuclear cataract, bilateral: Secondary | ICD-10-CM | POA: Diagnosis not present

## 2022-06-17 DIAGNOSIS — Z1330 Encounter for screening examination for mental health and behavioral disorders, unspecified: Secondary | ICD-10-CM | POA: Diagnosis not present

## 2022-06-17 DIAGNOSIS — M8580 Other specified disorders of bone density and structure, unspecified site: Secondary | ICD-10-CM | POA: Diagnosis not present

## 2022-06-17 DIAGNOSIS — Z86010 Personal history of colonic polyps: Secondary | ICD-10-CM | POA: Diagnosis not present

## 2022-06-17 DIAGNOSIS — E7800 Pure hypercholesterolemia, unspecified: Secondary | ICD-10-CM | POA: Diagnosis not present

## 2022-06-17 DIAGNOSIS — I480 Paroxysmal atrial fibrillation: Secondary | ICD-10-CM | POA: Diagnosis not present

## 2022-06-18 DIAGNOSIS — E7800 Pure hypercholesterolemia, unspecified: Secondary | ICD-10-CM | POA: Diagnosis not present

## 2022-08-27 DIAGNOSIS — L237 Allergic contact dermatitis due to plants, except food: Secondary | ICD-10-CM | POA: Diagnosis not present

## 2022-10-01 ENCOUNTER — Ambulatory Visit: Payer: Medicare HMO | Admitting: Family Medicine

## 2022-10-06 DIAGNOSIS — M5431 Sciatica, right side: Secondary | ICD-10-CM | POA: Diagnosis not present

## 2022-10-25 DIAGNOSIS — M79661 Pain in right lower leg: Secondary | ICD-10-CM | POA: Diagnosis not present

## 2022-11-01 DIAGNOSIS — E785 Hyperlipidemia, unspecified: Secondary | ICD-10-CM | POA: Diagnosis not present

## 2022-11-01 DIAGNOSIS — H269 Unspecified cataract: Secondary | ICD-10-CM | POA: Diagnosis not present

## 2022-11-01 DIAGNOSIS — Z7901 Long term (current) use of anticoagulants: Secondary | ICD-10-CM | POA: Diagnosis not present

## 2022-11-01 DIAGNOSIS — Z008 Encounter for other general examination: Secondary | ICD-10-CM | POA: Diagnosis not present

## 2022-11-01 DIAGNOSIS — Z7983 Long term (current) use of bisphosphonates: Secondary | ICD-10-CM | POA: Diagnosis not present

## 2022-11-01 DIAGNOSIS — D6869 Other thrombophilia: Secondary | ICD-10-CM | POA: Diagnosis not present

## 2022-11-01 DIAGNOSIS — Z823 Family history of stroke: Secondary | ICD-10-CM | POA: Diagnosis not present

## 2022-11-01 DIAGNOSIS — N182 Chronic kidney disease, stage 2 (mild): Secondary | ICD-10-CM | POA: Diagnosis not present

## 2022-11-01 DIAGNOSIS — I4891 Unspecified atrial fibrillation: Secondary | ICD-10-CM | POA: Diagnosis not present

## 2022-11-01 DIAGNOSIS — I129 Hypertensive chronic kidney disease with stage 1 through stage 4 chronic kidney disease, or unspecified chronic kidney disease: Secondary | ICD-10-CM | POA: Diagnosis not present

## 2022-11-01 DIAGNOSIS — M1990 Unspecified osteoarthritis, unspecified site: Secondary | ICD-10-CM | POA: Diagnosis not present

## 2022-11-01 DIAGNOSIS — E669 Obesity, unspecified: Secondary | ICD-10-CM | POA: Diagnosis not present

## 2022-11-01 DIAGNOSIS — H3530 Unspecified macular degeneration: Secondary | ICD-10-CM | POA: Diagnosis not present

## 2022-11-04 DIAGNOSIS — E785 Hyperlipidemia, unspecified: Secondary | ICD-10-CM | POA: Diagnosis not present

## 2022-11-04 DIAGNOSIS — R000 Tachycardia, unspecified: Secondary | ICD-10-CM | POA: Diagnosis not present

## 2022-11-04 DIAGNOSIS — G4733 Obstructive sleep apnea (adult) (pediatric): Secondary | ICD-10-CM | POA: Diagnosis not present

## 2022-11-04 DIAGNOSIS — E669 Obesity, unspecified: Secondary | ICD-10-CM | POA: Diagnosis not present

## 2022-11-04 DIAGNOSIS — I4891 Unspecified atrial fibrillation: Secondary | ICD-10-CM | POA: Diagnosis not present

## 2022-11-12 DIAGNOSIS — Z7901 Long term (current) use of anticoagulants: Secondary | ICD-10-CM | POA: Diagnosis not present

## 2022-11-12 DIAGNOSIS — M1711 Unilateral primary osteoarthritis, right knee: Secondary | ICD-10-CM | POA: Diagnosis not present

## 2022-11-12 DIAGNOSIS — I4891 Unspecified atrial fibrillation: Secondary | ICD-10-CM | POA: Diagnosis not present

## 2022-11-12 DIAGNOSIS — M7121 Synovial cyst of popliteal space [Baker], right knee: Secondary | ICD-10-CM | POA: Diagnosis not present

## 2022-11-12 DIAGNOSIS — M61452 Other calcification of muscle, left thigh: Secondary | ICD-10-CM | POA: Diagnosis not present

## 2022-11-20 ENCOUNTER — Other Ambulatory Visit: Payer: Self-pay | Admitting: Nurse Practitioner

## 2022-11-20 DIAGNOSIS — E782 Mixed hyperlipidemia: Secondary | ICD-10-CM

## 2022-11-22 ENCOUNTER — Other Ambulatory Visit: Payer: Self-pay | Admitting: Nurse Practitioner

## 2022-11-22 DIAGNOSIS — E782 Mixed hyperlipidemia: Secondary | ICD-10-CM

## 2022-11-22 NOTE — Telephone Encounter (Signed)
Called pt and confirmed she no longer is with our practice.

## 2022-11-22 NOTE — Telephone Encounter (Signed)
Requested medication (s) are due for refill today: yes  Requested medication (s) are on the active medication list: yes  Last refill:  05/24/22#90 1 RF  Future visit scheduled: no  Notes to clinic:  Is this pt stil a pt here?    Requested Prescriptions  Pending Prescriptions Disp Refills   rosuvastatin (CRESTOR) 5 MG tablet [Pharmacy Med Name: Rosuvastatin Calcium 5 MG Oral Tablet] 90 tablet 0    Sig: TAKE 1 TABLET BY MOUTH AT BEDTIME     Cardiovascular:  Antilipid - Statins 2 Failed - 11/20/2022  6:44 AM      Failed - Lipid Panel in normal range within the last 12 months    Cholesterol, Total  Date Value Ref Range Status  06/27/2015 216 (H) 100 - 199 mg/dL Final   Cholesterol  Date Value Ref Range Status  01/26/2022 147 <200 mg/dL Final   LDL Cholesterol (Calc)  Date Value Ref Range Status  01/26/2022 74 mg/dL (calc) Final    Comment:    Reference range: <100 . Desirable range <100 mg/dL for primary prevention;   <70 mg/dL for patients with CHD or diabetic patients  with > or = 2 CHD risk factors. Marland Kitchen LDL-C is now calculated using the Martin-Hopkins  calculation, which is a validated novel method providing  better accuracy than the Friedewald equation in the  estimation of LDL-C.  Horald Pollen et al. Lenox Ahr. 6295;284(13): 2061-2068  (http://education.[facility].com/faq/FAQ164)    HDL  Date Value Ref Range Status  01/26/2022 56 > OR = 50 mg/dL Final  24/40/1027 60 >25 mg/dL Final   Triglycerides  Date Value Ref Range Status  01/26/2022 83 <150 mg/dL Final         Passed - Cr in normal range and within 360 days    Creat  Date Value Ref Range Status  01/26/2022 0.79 0.50 - 1.05 mg/dL Final         Passed - Patient is not pregnant      Passed - Valid encounter within last 12 months    Recent Outpatient Visits           10 months ago Mixed hyperlipidemia   [place] [facility] Danelle Berry, PA-C   1 year ago Encounter for [employer]  annual wellness exam   [facility] Mecum, Oswaldo Conroy, PA-C   1 year ago Mixed hyperlipidemia   [facility] Danelle Berry, PA-C   1 year ago Mixed hyperlipidemia   [facility] Della Goo F, FNP   2 years ago Obesity (BMI 30.0-34.9)   [place] [facility] Just, Azalee Course, FNP

## 2022-11-24 DIAGNOSIS — R600 Localized edema: Secondary | ICD-10-CM | POA: Diagnosis not present

## 2022-11-24 DIAGNOSIS — M61452 Other calcification of muscle, left thigh: Secondary | ICD-10-CM | POA: Diagnosis not present

## 2022-11-24 NOTE — Telephone Encounter (Signed)
Unable to refill per protocol, no longer under prescriber care.  Requested Prescriptions  Pending Prescriptions Disp Refills   rosuvastatin (CRESTOR) 5 MG tablet [Pharmacy Med Name: Rosuvastatin Calcium 5 MG Oral Tablet] 90 tablet 0    Sig: TAKE 1 TABLET BY MOUTH AT BEDTIME     Cardiovascular:  Antilipid - Statins 2 Failed - 11/22/2022  4:20 PM      Failed - Lipid Panel in normal range within the last 12 months    Cholesterol, Total  Date Value Ref Range Status  06/27/2015 216 (H) 100 - 199 mg/dL Final   Cholesterol  Date Value Ref Range Status  01/26/2022 147 <200 mg/dL Final   LDL Cholesterol (Calc)  Date Value Ref Range Status  01/26/2022 74 mg/dL (calc) Final    Comment:    Reference range: <100 . Desirable range <100 mg/dL for primary prevention;   <70 mg/dL for patients with CHD or diabetic patients  with > or = 2 CHD risk factors. Marland Kitchen LDL-C is now calculated using the Martin-Hopkins  calculation, which is a validated novel method providing  better accuracy than the Friedewald equation in the  estimation of LDL-C.  Horald Pollen et al. Lenox Ahr. 4403;474(25): 2061-2068  (http://education.[facility].com/faq/FAQ164)    HDL  Date Value Ref Range Status  01/26/2022 56 > OR = 50 mg/dL Final  95/63/8756 60 >43 mg/dL Final   Triglycerides  Date Value Ref Range Status  01/26/2022 83 <150 mg/dL Final         Passed - Cr in normal range and within 360 days    Creat  Date Value Ref Range Status  01/26/2022 0.79 0.50 - 1.05 mg/dL Final         Passed - Patient is not pregnant      Passed - Valid encounter within last 12 months    Recent Outpatient Visits           10 months ago Mixed hyperlipidemia   [place] [facility] Danelle Berry, PA-C   1 year ago Encounter for [employer] annual wellness exam   [facility] Mecum, Oswaldo Conroy, PA-C   1 year ago Mixed hyperlipidemia   [facility] Danelle Berry,  PA-C   1 year ago Mixed hyperlipidemia   [facility] Della Goo F, FNP   2 years ago Obesity (BMI 30.0-34.9)   [place] [facility] Just, Azalee Course, FNP

## 2022-12-24 ENCOUNTER — Other Ambulatory Visit: Payer: Self-pay | Admitting: Family Medicine

## 2022-12-24 DIAGNOSIS — M899 Disorder of bone, unspecified: Secondary | ICD-10-CM

## 2022-12-24 DIAGNOSIS — M8589 Other specified disorders of bone density and structure, multiple sites: Secondary | ICD-10-CM

## 2022-12-24 DIAGNOSIS — Z9189 Other specified personal risk factors, not elsewhere classified: Secondary | ICD-10-CM

## 2023-01-03 ENCOUNTER — Other Ambulatory Visit: Payer: Self-pay | Admitting: Family Medicine

## 2023-01-03 DIAGNOSIS — Z1239 Encounter for other screening for malignant neoplasm of breast: Secondary | ICD-10-CM | POA: Diagnosis not present

## 2023-01-03 DIAGNOSIS — M8580 Other specified disorders of bone density and structure, unspecified site: Secondary | ICD-10-CM | POA: Diagnosis not present

## 2023-01-03 DIAGNOSIS — Z9189 Other specified personal risk factors, not elsewhere classified: Secondary | ICD-10-CM

## 2023-01-03 DIAGNOSIS — E7800 Pure hypercholesterolemia, unspecified: Secondary | ICD-10-CM | POA: Diagnosis not present

## 2023-01-03 DIAGNOSIS — I482 Chronic atrial fibrillation, unspecified: Secondary | ICD-10-CM | POA: Diagnosis not present

## 2023-01-03 DIAGNOSIS — M899 Disorder of bone, unspecified: Secondary | ICD-10-CM

## 2023-01-03 DIAGNOSIS — M8589 Other specified disorders of bone density and structure, multiple sites: Secondary | ICD-10-CM

## 2023-01-11 DIAGNOSIS — R2242 Localized swelling, mass and lump, left lower limb: Secondary | ICD-10-CM | POA: Diagnosis not present

## 2023-01-19 DIAGNOSIS — I4891 Unspecified atrial fibrillation: Secondary | ICD-10-CM | POA: Diagnosis not present

## 2023-02-08 DIAGNOSIS — Z1231 Encounter for screening mammogram for malignant neoplasm of breast: Secondary | ICD-10-CM | POA: Diagnosis not present

## 2023-02-08 DIAGNOSIS — R92323 Mammographic fibroglandular density, bilateral breasts: Secondary | ICD-10-CM | POA: Diagnosis not present

## 2023-02-21 DIAGNOSIS — N182 Chronic kidney disease, stage 2 (mild): Secondary | ICD-10-CM | POA: Diagnosis not present

## 2023-02-25 DIAGNOSIS — M7989 Other specified soft tissue disorders: Secondary | ICD-10-CM | POA: Diagnosis not present

## 2023-02-25 DIAGNOSIS — M8580 Other specified disorders of bone density and structure, unspecified site: Secondary | ICD-10-CM | POA: Diagnosis not present

## 2023-03-15 DIAGNOSIS — Z4789 Encounter for other orthopedic aftercare: Secondary | ICD-10-CM | POA: Diagnosis not present
